# Patient Record
Sex: Female | Born: 1960
Health system: Southern US, Community
[De-identification: ages and names within clinical notes are randomized; demographics above are authoritative.]

## PROBLEM LIST (undated history)

## (undated) DIAGNOSIS — Z9889 Other specified postprocedural states: Secondary | ICD-10-CM

## (undated) DIAGNOSIS — R112 Nausea with vomiting, unspecified: Secondary | ICD-10-CM

## (undated) DIAGNOSIS — F32A Depression, unspecified: Secondary | ICD-10-CM

## (undated) DIAGNOSIS — F419 Anxiety disorder, unspecified: Secondary | ICD-10-CM

## (undated) DIAGNOSIS — E039 Hypothyroidism, unspecified: Secondary | ICD-10-CM

## (undated) HISTORY — PX: ABDOMINAL HYSTERECTOMY: SHX81

---

## 2002-03-06 ENCOUNTER — Observation Stay (HOSPITAL_COMMUNITY): Admission: RE | Admit: 2002-03-06 | Discharge: 2002-03-07 | Payer: Self-pay

## 2003-04-24 ENCOUNTER — Encounter: Admission: RE | Admit: 2003-04-24 | Discharge: 2003-04-24 | Payer: Self-pay | Admitting: Orthopedic Surgery

## 2003-05-07 ENCOUNTER — Encounter: Admission: RE | Admit: 2003-05-07 | Discharge: 2003-05-07 | Payer: Self-pay | Admitting: Orthopedic Surgery

## 2005-05-09 HISTORY — PX: GALLBLADDER SURGERY: SHX652

## 2006-05-09 HISTORY — PX: BREAST BIOPSY: SHX20

## 2017-06-04 NOTE — Progress Notes (Signed)
Sandra Stanton [address] [place], [place] [address] Phone: [phone] Subjective:     CC: Right shoulder pain  BJY:[id number]  Sandra Stanton is a 57 y.o. female coming in with complaint of right shoulder pain. States she tried to do push ups when she injured her shoulder. Doesn't recall hearing a pop. She's stopped doing pushups and free weights. She states that if she uses her arm a lot distally her forearm starts to ache.   Onset- 3 months ago Location- lateral shoulder  Duration- Character- Ache, sharp, burning sensation when she reaches up Aggravating factors- Reaching up, sleeping on her right side,  Reliving factors-  Therapies tried- Ice, Ibuprofen, CBD oil Severity-5 out of 10 and worsening     History reviewed. No pertinent past medical history. Past Surgical History:  Procedure Laterality Date  . BREAST BIOPSY  2008  . CESAREAN SECTION  [id number]  . GALLBLADDER SURGERY  2007   Social History   Socioeconomic History  . Marital status: Married    Spouse name: None  . Number of children: None  . Years of education: None  . Highest education level: None  Social Needs  . Financial resource strain: None  . Food insecurity - worry: None  . Food insecurity - inability: None  . Transportation needs - medical: None  . Transportation needs - non-medical: None  Occupational History  . None  Tobacco Use  . Smoking status: Former Games developer  . Smokeless tobacco: Never Used  Substance and Sexual Activity  . Alcohol use: None  . Drug use: None  . Sexual activity: None  Other Topics Concern  . None  Social History Narrative  . None   Not on File History reviewed. No pertinent family history.   Past medical history, social, surgical and family history all reviewed in electronic medical record.  No pertanent information unless stated regarding to the chief complaint.   Review of Systems:Review of systems updated and as accurate  as of 06/05/17  No headache, visual changes, nausea, vomiting, diarrhea, constipation, dizziness, abdominal pain, skin rash, fevers, chills, night sweats, weight loss, swollen lymph nodes, body aches, joint swelling, muscle aches, chest pain, shortness of breath, mood changes.   Objective  Blood pressure 110/78, pulse 66, height 5\' 6"  (1.676 m), weight 148 lb (67.1 kg), SpO2 97 %. Systems examined below as of 06/05/17   General: No apparent distress alert and oriented x3 mood and affect normal, dressed appropriately.  HEENT: Pupils equal, extraocular movements intact  Respiratory: Patient's speak in full sentences and does not appear short of breath  Cardiovascular: No lower extremity edema, non tender, no erythema  Skin: Warm dry intact with no signs of infection or rash on extremities or on axial skeleton.  Abdomen: Soft nontender  Neuro: Cranial nerves II through XII are intact, neurovascularly intact in all extremities with 2+ DTRs and 2+ pulses.  Lymph: No lymphadenopathy of posterior or anterior cervical chain or axillae bilaterally.  Gait normal with good balance and coordination.  MSK:  Non tender with full range of motion and good stability and symmetric strength and tone of  elbows, wrist, hip, knee and ankles bilaterally.  Shoulder: Right Inspection reveals no abnormalities, atrophy or asymmetry. Palpation is normal with no tenderness over AC joint or bicipital groove. ROM is full in all planes passively. Rotator cuff strength normal throughout. signs of impingement with positive Neer and Hawkin's tests, but negative empty can sign. Speeds and Yergason's tests  normal. No labral pathology noted with negative Obrien's, negative clunk and good stability. Normal scapular function observed. No painful arc and no drop arm sign. No apprehension sign  MSK US performed of: Right This study was ordered, performed, and interpreted by Terrilee FilesZach Smith D.O.  Shoulder:   Supraspinatus:   Appears normal on long and transverse views, Bursal bulge seen with shoulder abduction on impingement view.  Patient also does have what appears to be a small compression fracture noted.  No true healing aspect noted.  Right at the insertion of the supraspinatus. AC joint: Mild arthritis Glenohumeral Joint:  Appears normal without effusion. Glenoid Labrum:  Intact without visualized tears. Biceps Tendon:  Appears normal on long and transverse views, no fraying of tendon, tendon located in intertubercular groove, no subluxation with shoulder internal or external rotation.  Impression: Subacromial bursitis, underlying fracture noted.  [id number]; 15 additional minutes spent for Therapeutic exercises as stated in above notes.  This included exercises focusing on stretching, strengthening, with significant focus on eccentric aspects.   Long term goals include an improvement in range of motion, strength, endurance as well as avoiding reinjury. Patient's frequency would include in 1-2 times a day, 3-5 times a week for a duration of 6-12 weeks. Shoulder Exercises that included:  Basic scapular stabilization to include adduction and depression of scapula Scaption, focusing on proper movement and good control Internal and External rotation utilizing a theraband, with elbow tucked at side entire time Rows with theraband which was given    Proper technique shown and discussed handout in great detail with ATC.  All questions were discussed and answered.      Impression and Recommendations:     This case required medical decision making of moderate complexity.      Note: This dictation was prepared with Dragon dictation along with smaller phrase technology. Any transcriptional errors that result from this process are unintentional.

## 2017-06-05 ENCOUNTER — Ambulatory Visit (INDEPENDENT_AMBULATORY_CARE_PROVIDER_SITE_OTHER): Payer: BLUE CROSS/BLUE SHIELD | Admitting: Family Medicine

## 2017-06-05 ENCOUNTER — Ambulatory Visit (INDEPENDENT_AMBULATORY_CARE_PROVIDER_SITE_OTHER)
Admission: RE | Admit: 2017-06-05 | Discharge: 2017-06-05 | Disposition: A | Discharge status: Home / Self Care | Payer: BLUE CROSS/BLUE SHIELD | Source: Ambulatory Visit | Attending: Family Medicine | Admitting: Family Medicine

## 2017-06-05 ENCOUNTER — Encounter: Payer: Self-pay | Admitting: Family Medicine

## 2017-06-05 ENCOUNTER — Ambulatory Visit: Payer: Self-pay

## 2017-06-05 VITALS — BP 110/78 | HR 66 | Ht 66.0 in | Wt 148.0 lb

## 2017-06-05 DIAGNOSIS — G8929 Other chronic pain: Secondary | ICD-10-CM | POA: Insufficient documentation

## 2017-06-05 DIAGNOSIS — M25511 Pain in right shoulder: Secondary | ICD-10-CM

## 2017-06-05 MED ORDER — VITAMIN D (ERGOCALCIFEROL) 1.25 MG (50000 UNIT) PO CAPS: 50000.0000 [IU] | ORAL_CAPSULE | ORAL | 0 refills | Status: DC

## 2017-06-05 NOTE — Assessment & Plan Note (Signed)
Truly going on 3 months.  Very small intrasubstance tear noted of the supraspinatus but no true retraction noted.  Patient does have a mild fullness in the lateral aspect of the humerus and I want to rule out any type of fracture.  Patient on ultrasound did have what appeared to be a humeral compression fracture on the anterior aspect anterior superior aspect.  Patient given home exercises, once weekly vitamin D, topical anti-inflammatories to try.  Patient will try these interventions and come back and see me again 4-6 weeks.  Consider injections and physical therapy.

## 2017-06-05 NOTE — Patient Instructions (Signed)
Good to see yo u Ice 20 minutes 2 times daily. Usually after activity and before bed. Exercises 3 times a week.  pennsaid pinkie amount topically 2 times daily as needed.  Once weekly vitamin D for 12 weeks Try to increase activity but weight 50% of what you were doing and keep hands within peripheral vision  See me again in 4 weeks.

## 2017-06-12 DIAGNOSIS — D225 Melanocytic nevi of trunk: Secondary | ICD-10-CM | POA: Diagnosis not present

## 2017-06-12 DIAGNOSIS — Z85820 Personal history of malignant melanoma of skin: Secondary | ICD-10-CM | POA: Diagnosis not present

## 2017-06-12 DIAGNOSIS — Z08 Encounter for follow-up examination after completed treatment for malignant neoplasm: Secondary | ICD-10-CM | POA: Diagnosis not present

## 2017-06-13 DIAGNOSIS — F320 Major depressive disorder, single episode, mild: Secondary | ICD-10-CM | POA: Diagnosis not present

## 2017-06-27 DIAGNOSIS — F320 Major depressive disorder, single episode, mild: Secondary | ICD-10-CM | POA: Diagnosis not present

## 2017-07-02 NOTE — Progress Notes (Signed)
Sandra Stanton, Sandra Stanton Phone: [phone] Subjective:     CC: Right shoulder pain follow-up  BJY:NWGNFAOZHY  Sandra Stanton is a 57 y.o. female coming in with complaint of right shoulder pain.  Found to have what appeared to be a potential compression fracture.  Also found for some subacromial bursitis.  Patient given exercises, icing regimen, and vitamin D.  Patient states   may be a little better but not severely better.  Patient states that the pain is still waking him up at night.     No past medical history on file. Past Surgical History:  Procedure Laterality Date  . BREAST BIOPSY  2008  . CESAREAN SECTION  [id number]  . GALLBLADDER SURGERY  2007   Social History   Socioeconomic History  . Marital status: Married    Spouse name: None  . Number of children: None  . Years of education: None  . Highest education level: None  Social Needs  . Financial resource strain: None  . Food insecurity - worry: None  . Food insecurity - inability: None  . Transportation needs - medical: None  . Transportation needs - non-medical: None  Occupational History  . None  Tobacco Use  . Smoking status: Former Games developer  . Smokeless tobacco: Never Used  Substance and Sexual Activity  . Alcohol use: None  . Drug use: None  . Sexual activity: None  Other Topics Concern  . None  Social History Narrative  . None   Not on File No family history on file.  No family history of autoimmune   Past medical history, social, surgical and family history all reviewed in electronic medical record.  No pertanent information unless stated regarding to the chief complaint.   Review of Systems:Review of systems updated and as accurate as of 07/03/17  No headache, visual changes, nausea, vomiting, diarrhea, constipation, dizziness, abdominal pain, skin rash, fevers, chills, night sweats, weight loss, swollen lymph nodes, body aches, joint  swelling, chest pain, shortness of breath, mood changes.  Positive muscle aches  Objective  Blood pressure 100/62, pulse 69, height 5\' 6"  (1.676 m), weight 147 lb (66.7 kg), SpO2 98 %. Systems examined below as of 07/03/17   General: No apparent distress alert and oriented x3 mood and affect normal, dressed appropriately.  HEENT: Pupils equal, extraocular movements intact  Respiratory: Patient's speak in full sentences and does not appear short of breath  Cardiovascular: No lower extremity edema, non tender, no erythema  Skin: Warm dry intact with no signs of infection or rash on extremities or on axial skeleton.  Abdomen: Soft nontender  Neuro: Cranial nerves II through XII are intact, neurovascularly intact in all extremities with 2+ DTRs and 2+ pulses.  Lymph: No lymphadenopathy of posterior or anterior cervical chain or axillae bilaterally.  Gait normal with good balance and coordination.  MSK:  Non tender with full range of motion and good stability and symmetric strength and tone of  elbows, wrist, hip, knee and ankles bilaterally.   Shoulder: Right Inspection reveals no abnormalities, atrophy or asymmetry. Palpation is normal with no tenderness over AC joint or bicipital groove. ROM is full in all planes. Rotator cuff strength normal throughout. Positive impingement Speeds and Yergason's tests normal. No labral pathology noted with negative Obrien's, negative clunk and good stability. Normal scapular function observed. No painful arc and no drop arm sign. No apprehension sign Contralateral shoulder unremarkable  After informed written and  verbal consent, patient was seated on exam table. Right shoulder was prepped with alcohol swab and utilizing posterior approach, patient's right glenohumeral space was injected with 4:1  marcaine 0.5%: Kenalog 40mg /dL. Patient tolerated the procedure well without immediate complications.       Impression and Recommendations:     This  case required medical decision making of moderate complexity.      Note: This dictation was prepared with Dragon dictation along with smaller phrase technology. Any transcriptional errors that result from this process are unintentional.

## 2017-07-03 ENCOUNTER — Ambulatory Visit (INDEPENDENT_AMBULATORY_CARE_PROVIDER_SITE_OTHER): Payer: BLUE CROSS/BLUE SHIELD | Admitting: Family Medicine

## 2017-07-03 ENCOUNTER — Encounter: Payer: Self-pay | Admitting: Family Medicine

## 2017-07-03 ENCOUNTER — Ambulatory Visit: Payer: Self-pay

## 2017-07-03 VITALS — BP 100/62 | HR 69 | Ht 66.0 in | Wt 147.0 lb

## 2017-07-03 DIAGNOSIS — M25511 Pain in right shoulder: Principal | ICD-10-CM

## 2017-07-03 DIAGNOSIS — G8929 Other chronic pain: Secondary | ICD-10-CM

## 2017-07-03 MED ORDER — VITAMIN D (ERGOCALCIFEROL) 1.25 MG (50000 UNIT) PO CAPS: 50000.0000 [IU] | ORAL_CAPSULE | ORAL | 0 refills | Status: DC

## 2017-07-03 NOTE — Assessment & Plan Note (Signed)
Patient given injection today.  I do think that there is still some bony abnormality.  Encourage patient to continue the once weekly vitamin D.  We discussed icing regimen.  Follow-up again in 4 weeks

## 2017-07-03 NOTE — Patient Instructions (Addendum)
Good to see you  Ice is your friend Take 2 days off then start the exercises again .  Injected the shoulder today  Refilled the vitamin D  See me again in 4 weeks

## 2017-07-11 DIAGNOSIS — F320 Major depressive disorder, single episode, mild: Secondary | ICD-10-CM | POA: Diagnosis not present

## 2017-07-27 DIAGNOSIS — F320 Major depressive disorder, single episode, mild: Secondary | ICD-10-CM | POA: Diagnosis not present

## 2017-07-30 NOTE — Progress Notes (Signed)
Tawana ScaleZach Darius Fillingim D.O. [facility] [address] [place], [place] [address] Phone: [phone] Subjective:     CC: Right shoulder pain follow-up  BJY:NWGNFAOZHYHPI:Subjective  Sandra StainJennifer C Stanton is a [age] y.o. female coming in with complaint of right shoulder pain.  Found to have a small compression fracture.  Started once weekly vitamin D.  Continue to have a small effusion was given injection July 03, 2017.  Patient was to continue conservative therapy otherwise including home exercises and icing regimen.  Patient states that her shoulder is not getting any better. She did put some lotion on her lower back performing internal rotation and this caused an increase in her pain. Patient states that she also reached over to her alarm and overextended her arm.      No past medical history on file. Past Surgical History:  Procedure Laterality Date  . BREAST BIOPSY  2008  . CESAREAN SECTION  [id number],1990  . GALLBLADDER SURGERY  2007   Social History   Socioeconomic History  . Marital status: Married    Spouse name: Not on file  . Number of children: Not on file  . Years of education: Not on file  . Highest education level: Not on file  Occupational History  . Not on file  Social Needs  . Financial resource strain: Not on file  . Food insecurity:    Worry: Not on file    Inability: Not on file  . Transportation needs:    Medical: Not on file    Non-medical: Not on file  Tobacco Use  . Smoking status: Former Games developermoker  . Smokeless tobacco: Never Used  Substance and Sexual Activity  . Alcohol use: Not on file  . Drug use: Not on file  . Sexual activity: Not on file  Lifestyle  . Physical activity:    Days per week: Not on file    Minutes per session: Not on file  . Stress: Not on file  Relationships  . Social connections:    Talks on phone: Not on file    Gets together: Not on file    Attends religious service: Not on file    Active member of club or organization: Not on file   Attends meetings of clubs or organizations: Not on file    Relationship status: Not on file  Other Topics Concern  . Not on file  Social History Narrative  . Not on file   Not on File No family history on file.   Past medical history, social, surgical and family history all reviewed in electronic medical record.  No pertanent information unless stated regarding to the chief complaint.   Review of Systems:Review of systems updated and as accurate as of 07/31/17  No headache, visual changes, nausea, vomiting, diarrhea, constipation, dizziness, abdominal pain, skin rash, fevers, chills, night sweats, weight loss, swollen lymph nodes, body aches, joint swelling, muscle aches, chest pain, shortness of breath, mood changes.   Objective  Blood pressure 118/76, pulse 80, height 5\' 6"  (1.676 m), weight 144 lb (65.3 kg), SpO2 98 %. Systems examined below as of 07/31/17   General: No apparent distress alert and oriented x3 mood and affect normal, dressed appropriately.  HEENT: Pupils equal, extraocular movements intact  Respiratory: Patient's speak in full sentences and does not appear short of breath  Cardiovascular: No lower extremity edema, non tender, no erythema  Skin: Warm dry intact with no signs of infection or rash on extremities or on axial skeleton.  Abdomen: Soft nontender  Neuro: Cranial nerves II through XII are intact, neurovascularly intact in all extremities with 2+ DTRs and 2+ pulses.  Lymph: No lymphadenopathy of posterior or anterior cervical chain or axillae bilaterally.  Gait normal with good balance and coordination.  MSK:  Non tender with full range of motion and good stability and symmetric strength and tone of  elbows, wrist, hip, knee and ankles bilaterally.  Shoulder: Right Inspection reveals no abnormalities, atrophy or asymmetry. Palpation is normal with no tenderness over AC joint or bicipital groove. ROM is full in all planes. Rotator cuff strength 4+ out of  5 No signs of impingement with negative Neer and Hawkin's tests, empty can sign. Speeds and Yergason's tests normal. Positive O'Brien's Bilateral shoulder unremarkable  MSK US performed of: Right  this study was ordered, performed, and interpreted by Terrilee Files D.O.  Shoulder:   Supraspinatus: Patient does have some degenerative tearing of the supraspinatus noted.  Patient does have what appears to be a possible anterior labral tear also noted.  Patient does have a small glenohumeral joint effusion noted.   Impression and Recommendations:     This case required medical decision making of moderate complexity.      Note: This dictation was prepared with Dragon dictation along with smaller phrase technology. Any transcriptional errors that result from this process are unintentional.

## 2017-07-31 ENCOUNTER — Ambulatory Visit: Payer: Self-pay

## 2017-07-31 ENCOUNTER — Encounter: Payer: Self-pay | Admitting: Family Medicine

## 2017-07-31 ENCOUNTER — Ambulatory Visit (INDEPENDENT_AMBULATORY_CARE_PROVIDER_SITE_OTHER): Payer: BLUE CROSS/BLUE SHIELD | Admitting: Family Medicine

## 2017-07-31 VITALS — BP 118/76 | HR 80 | Ht 66.0 in | Wt 144.0 lb

## 2017-07-31 DIAGNOSIS — G8929 Other chronic pain: Secondary | ICD-10-CM

## 2017-07-31 DIAGNOSIS — M25511 Pain in right shoulder: Principal | ICD-10-CM

## 2017-07-31 NOTE — Patient Instructions (Signed)
Good to see you  We will get the MR-arthrogram.  Gustavus Bryantce is your friend.  Keep hands within peripheral visioon  See me again 1-2 days after MRI and we will discuss

## 2017-07-31 NOTE — Assessment & Plan Note (Signed)
Patient is failed all conservative therapy.  Patient was given an injection but did not make significant improvement.  Patient is continuing to have pain on a daily basis and even small amount of movement seem to be making it worse.  Rates the severity of pain is 8 out of 10.  I believe that a MR arthrogram is necessary at this time with patient not making significant strides of the course the last several months and has failed all conservative therapy.  Arthrogram will be done to make sure and rule out labral tear.  Patient will follow-up in 1-2 days after the imaging and discuss further.

## 2017-08-02 ENCOUNTER — Encounter: Payer: Self-pay | Admitting: Family Medicine

## 2017-08-08 DIAGNOSIS — F320 Major depressive disorder, single episode, mild: Secondary | ICD-10-CM | POA: Diagnosis not present

## 2017-08-16 ENCOUNTER — Ambulatory Visit
Admission: RE | Admit: 2017-08-16 | Discharge: 2017-08-16 | Disposition: A | Discharge status: Home / Self Care | Payer: BLUE CROSS/BLUE SHIELD | Source: Ambulatory Visit | Attending: Family Medicine | Admitting: Family Medicine

## 2017-08-16 DIAGNOSIS — M25511 Pain in right shoulder: Secondary | ICD-10-CM | POA: Diagnosis not present

## 2017-08-16 DIAGNOSIS — M19011 Primary osteoarthritis, right shoulder: Secondary | ICD-10-CM | POA: Diagnosis not present

## 2017-08-16 DIAGNOSIS — M7591 Shoulder lesion, unspecified, right shoulder: Secondary | ICD-10-CM | POA: Diagnosis not present

## 2017-08-16 DIAGNOSIS — G8929 Other chronic pain: Secondary | ICD-10-CM

## 2017-08-16 MED ORDER — IOPAMIDOL (ISOVUE-M 200) INJECTION 41%
15.0000 mL | Freq: Once | INTRAMUSCULAR | Status: AC
  Administered 2017-08-16: 15 mL via INTRA_ARTICULAR

## 2017-08-21 NOTE — Progress Notes (Signed)
[name] [address] [place], [place] [address] Phone: [phone] Subjective:      CC: Shoulder pain follow-up.  [id number]  Sandra Stanton is a 57 y.o. female coming in with complaint of shoulder pain. She continues to have pain and is here to discuss her MRI.  Additionally ultrasound showed what seemed to be more of a small compression fracture.  Was not making any improvement.  Attempted injection.  Patient did have the MRI that was independently visualized by me showing a sprain of the ligament but otherwise unremarkable. Patient actually states that the pain during the day seems to be improved.    No past medical history on file. Past Surgical History:  Procedure Laterality Date  . BREAST BIOPSY  2008  . CESAREAN SECTION  [id number]  . GALLBLADDER SURGERY  2007   Social History   Socioeconomic History  . Marital status: Married    Spouse name: Not on file  . Number of children: Not on file  . Years of education: Not on file  . Highest education level: Not on file  Occupational History  . Not on file  Social Needs  . Financial resource strain: Not on file  . Food insecurity:    Worry: Not on file    Inability: Not on file  . Transportation needs:    Medical: Not on file    Non-medical: Not on file  Tobacco Use  . Smoking status: Former Games developer  . Smokeless tobacco: Never Used  Substance and Sexual Activity  . Alcohol use: Not on file  . Drug use: Not on file  . Sexual activity: Not on file  Lifestyle  . Physical activity:    Days per week: Not on file    Minutes per session: Not on file  . Stress: Not on file  Relationships  . Social connections:    Talks on phone: Not on file    Gets together: Not on file    Attends religious service: Not on file    Active member of club or organization: Not on file    Attends meetings of clubs or organizations: Not on file    Relationship status: Not on file  Other Topics  Concern  . Not on file  Social History Narrative  . Not on file   Not on File No family history on file.  No family history of autoimmune   Past medical history, social, surgical and family history all reviewed in electronic medical record.  No pertanent information unless stated regarding to the chief complaint.   Review of Systems:Review of systems updated and as accurate as of 08/22/17  No headache, visual changes, nausea, vomiting, diarrhea, constipation, dizziness, abdominal pain, skin rash, fevers, chills, night sweats, weight loss, swollen lymph nodes, body aches, joint swelling,  chest pain, shortness of breath, mood changes.  Positive muscle aches  Objective  Blood pressure 102/64, pulse 78, height 5\' 6"  (1.676 m), weight 145 lb (65.8 kg), SpO2 98 %. Systems examined below as of 08/22/17   General: No apparent distress alert and oriented x3 mood and affect normal, dressed appropriately.  HEENT: Pupils equal, extraocular movements intact  Respiratory: Patient's speak in full sentences and does not appear short of breath  Cardiovascular: No lower extremity edema, non tender, no erythema  Skin: Warm dry intact with no signs of infection or rash on extremities or on axial skeleton.  Abdomen: Soft nontender  Neuro: Cranial nerves II through XII  are intact, neurovascularly intact in all extremities with 2+ DTRs and 2+ pulses.  Lymph: No lymphadenopathy of posterior or anterior cervical chain or axillae bilaterally.  Gait normal with good balance and coordination.  MSK:  Non tender with full range of motion and good stability and symmetric strength and tone of  elbows, wrist, hip, knee and ankles bilaterally.  Shoulder: Right Inspection reveals no abnormalities, atrophy or asymmetry. Palpation is normal with no tenderness over AC joint or bicipital groove. ROM is limited actively but does have some near full passive range of motion though. Rotator cuff strength normal  throughout. Positive impingement Speeds and Yergason's tests normal. No labral pathology noted with negative Obrien's, negative clunk and good stability. Normal scapular function observed. No painful arc and no drop arm sign. No apprehension sign Contralateral shoulder unremarkable.     Impression and Recommendations:     This case required medical decision making of moderate complexity.      Note: This dictation was prepared with Dragon dictation along with smaller phrase technology. Any transcriptional errors that result from this process are unintentional.

## 2017-08-22 ENCOUNTER — Encounter: Payer: Self-pay | Admitting: Family Medicine

## 2017-08-22 ENCOUNTER — Ambulatory Visit (INDEPENDENT_AMBULATORY_CARE_PROVIDER_SITE_OTHER): Payer: BLUE CROSS/BLUE SHIELD | Admitting: Family Medicine

## 2017-08-22 DIAGNOSIS — G8929 Other chronic pain: Secondary | ICD-10-CM | POA: Diagnosis not present

## 2017-08-22 DIAGNOSIS — M25511 Pain in right shoulder: Secondary | ICD-10-CM

## 2017-08-22 DIAGNOSIS — F320 Major depressive disorder, single episode, mild: Secondary | ICD-10-CM | POA: Diagnosis not present

## 2017-08-22 MED ORDER — GABAPENTIN 100 MG PO CAPS: 200.0000 mg | ORAL_CAPSULE | Freq: Every day | ORAL | 3 refills | Status: DC

## 2017-08-22 NOTE — Patient Instructions (Addendum)
Good to see you  Sandra Stanton is your friend.  Keep hands within peripheral vision  New exercises 3 times a week  Gabapentin 200mg  at night See me again in 4 weeks

## 2017-08-22 NOTE — Assessment & Plan Note (Signed)
MRI was fairly unremarkable.  Patient declined formal physical therapy or injection again today.  Patient wants to try conservative therapy including icing regimen, home exercises, will discuss with patient about which activities to do which wants to avoid.  Patient will follow up with me again 4 weeks.  At that time if continued to have difficulty will refer patient.

## 2017-08-28 ENCOUNTER — Encounter: Payer: Self-pay | Admitting: Family Medicine

## 2017-09-18 NOTE — Progress Notes (Signed)
[name] [address] [place], [place] [address] Phone: [phone] Subjective:     CC: Shoulder pain follow-up  [id number]  Sandra Stanton is a 57 y.o. female coming in with complaint of shoulder pain. Patient states that she is doing better since last visit. She did take one week off of work to help rest her shoulder. She did work Friday without a sling and had little pain. She notices that she does not have full range of motion. She is doing her exercise daily.  Patient was sent for an MRI that showed the patient did have a sprain of the inferior glenoid ligament but I believe though when independently visualized seems to be early frozen shoulder.  Patient's MRI otherwise was fairly unremarkable.      No past medical history on file. Past Surgical History:  Procedure Laterality Date  . BREAST BIOPSY  2008  . CESAREAN SECTION  [id number]  . GALLBLADDER SURGERY  2007   Social History   Socioeconomic History  . Marital status: Married    Spouse name: Not on file  . Number of children: Not on file  . Years of education: Not on file  . Highest education level: Not on file  Occupational History  . Not on file  Social Needs  . Financial resource strain: Not on file  . Food insecurity:    Worry: Not on file    Inability: Not on file  . Transportation needs:    Medical: Not on file    Non-medical: Not on file  Tobacco Use  . Smoking status: Former Games developer  . Smokeless tobacco: Never Used  Substance and Sexual Activity  . Alcohol use: Not on file  . Drug use: Not on file  . Sexual activity: Not on file  Lifestyle  . Physical activity:    Days per week: Not on file    Minutes per session: Not on file  . Stress: Not on file  Relationships  . Social connections:    Talks on phone: Not on file    Gets together: Not on file    Attends religious service: Not on file    Active member of club or organization: Not on file    Attends  meetings of clubs or organizations: Not on file    Relationship status: Not on file  Other Topics Concern  . Not on file  Social History Narrative  . Not on file   Not on File No family history on file.   Past medical history, social, surgical and family history all reviewed in electronic medical record.  No pertanent information unless stated regarding to the chief complaint.   Review of Systems:Review of systems updated and as accurate as of 09/19/17  No headache, visual changes, nausea, vomiting, diarrhea, constipation, dizziness, abdominal pain, skin rash, fevers, chills, night sweats, weight loss, swollen lymph nodes, body aches, joint swelling, muscle aches, chest pain, shortness of breath, mood changes.   Objective  Blood pressure 110/76, pulse 79, height  (1.676 m), weight 150 lb (68 kg), SpO2 99 %. Systems examined below as of 09/19/17   General: No apparent distress alert and oriented x3 mood and affect normal, dressed appropriately.  HEENT: Pupils equal, extraocular movements intact  Respiratory: Patient's speak in full sentences and does not appear short of breath  Cardiovascular: No lower extremity edema, non tender, no erythema  Skin: Warm dry intact with no signs of infection or rash on extremities  or on axial skeleton.  Abdomen: Soft nontender  Neuro: Cranial nerves II through XII are intact, neurovascularly intact in all extremities with 2+ DTRs and 2+ pulses.  Lymph: No lymphadenopathy of posterior or anterior cervical chain or axillae bilaterally.  Gait normal with good balance and coordination.  MSK:  Non tender with full range of motion and good stability and symmetric strength and tone of  elbows, wrist, hip, knee and ankles bilaterally.   Right shoulder exam shows the patient does have positive impingement.  Range of motion is decreased in all planes especially with external range of motion.  Patient has some mild crepitus noted.  Good grip strength.  4+ out  of 5 strength compared to the contralateral side.    Impression and Recommendations:     This case required medical decision making of moderate complexity.      Note: This dictation was prepared with Dragon dictation along with smaller phrase technology. Any transcriptional errors that result from this process are unintentional.

## 2017-09-19 ENCOUNTER — Ambulatory Visit (INDEPENDENT_AMBULATORY_CARE_PROVIDER_SITE_OTHER): Payer: BLUE CROSS/BLUE SHIELD | Admitting: Family Medicine

## 2017-09-19 ENCOUNTER — Encounter: Payer: Self-pay | Admitting: Family Medicine

## 2017-09-19 VITALS — BP 110/76 | HR 79 | Ht 66.0 in | Wt 150.0 lb

## 2017-09-19 DIAGNOSIS — M7501 Adhesive capsulitis of right shoulder: Secondary | ICD-10-CM | POA: Diagnosis not present

## 2017-09-19 NOTE — Patient Instructions (Signed)
Good to see you  Overall not bad news  Stay active and push it Pt will be calling you  I want to see you again in 3-4 weeks and then at that time we can try another injection to get you the rest of the way I hope

## 2017-09-19 NOTE — Assessment & Plan Note (Addendum)
Rule out any significant intra-articular pathology.  Patient does have some tightness and encourage her to go to formal physical therapy.  Patient is willing to do that at this moment.  Discussed continue with the gabapentin for nighttime pain as well as once weekly vitamin D.  Depending on patient response patient could be ready for another injection at follow-up.  Follow-up again in 6 weeks Spent  25 minutes with patient face-to-face and had greater than 50% of counseling including as described above in assessment and plan.

## 2017-09-26 DIAGNOSIS — F320 Major depressive disorder, single episode, mild: Secondary | ICD-10-CM | POA: Diagnosis not present

## 2017-10-03 ENCOUNTER — Encounter: Payer: Self-pay | Admitting: Rehabilitative and Restorative Service Providers"

## 2017-10-03 ENCOUNTER — Ambulatory Visit (INDEPENDENT_AMBULATORY_CARE_PROVIDER_SITE_OTHER): Payer: BLUE CROSS/BLUE SHIELD | Admitting: Rehabilitative and Restorative Service Providers"

## 2017-10-03 DIAGNOSIS — M25611 Stiffness of right shoulder, not elsewhere classified: Secondary | ICD-10-CM

## 2017-10-03 DIAGNOSIS — M25511 Pain in right shoulder: Secondary | ICD-10-CM

## 2017-10-03 DIAGNOSIS — R293 Abnormal posture: Secondary | ICD-10-CM

## 2017-10-03 DIAGNOSIS — G8929 Other chronic pain: Secondary | ICD-10-CM

## 2017-10-03 NOTE — Patient Instructions (Addendum)
Self massage - using ~ 4 inch plastic ball   Axial Extension (Chin Tuck)    Pull chin in and lengthen back of neck. Hold __5-10__ seconds while counting out loud. Repeat __10__ times. Do __several__ sessions per day.  Shoulder Blade Squeeze   With swim noodle  Rotate shoulders back, then squeeze shoulder blades down and backr. Hold 10 sec Repeat __10__ times. Do _several __ sessions per day.  Upper Back Strength: Lower Trapezius / Rotator Cuff " L's "     Arms in waitress pose, palms up. Press hands back and slide shoulder blades down. Hold for __5__ seconds. Repeat _10___ times. 1-2 times per day.    Scapular Retraction: Elbow Flexion (Standing)  "W's"     With elbows bent to 90, pinch shoulder blades together and rotate arms out, keeping elbows bent. Repeat __10__ times per set. Do __1-2__ sets per session. Do _several ___ sessions per day.   ROM: External / Internal Rotation - Wand standing     Holding wand with left hand palm up, push out from body with other hand, palm down. Keep both elbows bent. When stretch is felt, hold __30__ seconds. Repeat to other side, leading with same hand. Keep elbows bent. Repeat __3__ times per set.  Do __2-3__ sessions per day.  ELBOW: Biceps - Standing    Standing at counter or in doorway, place one hand on wall, elbow straight. Lean forward. Hold _30__ seconds. __2-3_ reps per set, __2-3_ sets per day  Pulley flexion  [facility]  Or on line       [facility] [address] [place], [place] [address]  [phone] (office) [phone] (fax)

## 2017-10-03 NOTE — Therapy (Signed)
[facility] [place] [address] [place], [place], [address] Phone: [phone]   Fax:  [phone]  Physical Therapy Evaluation  Patient Details  Name: Sandra Stanton MRN: [id number] Date of Birth: November 06, 1960 Referring Provider: Dr Terrilee Files    Encounter Date: 10/03/2017  PT End of Session - 10/03/17 1013    Visit Number  1    Number of Visits  12    Date for PT Re-Evaluation  11/07/17    PT Start Time  1013    PT Stop Time  1114    PT Time Calculation (min)  61 min    Activity Tolerance  Patient tolerated treatment well       History reviewed. No pertinent past medical history.  Past Surgical History:  Procedure Laterality Date  . BREAST BIOPSY  2008  . CESAREAN SECTION  [id number]  . GALLBLADDER SURGERY  2007    There were no vitals filed for this visit.   Subjective Assessment - 10/03/17 1023    Subjective  Patient reports that she has had Rt shoulder pain since 10/18 when she injured Rt shoulder doing push ups. She fell the end of November 2018 and symptoms have continued since that time. She has been seen by Dr Katrinka Blazing several times receiving an injection 07/03/17 with some improvement. Symptoms have continued with MRI showing irritation of with supraspinitus and underlying fx and impingement. Symptoms have continued to progress - even with injection; exercise and medication.     Pertinent History  c-sections; gall baldder surgery; hysterectomy; melanoma Rt distal biceps - removed ~ 3-4 yrs ago - no further treatment f/u every 6 months     Diagnostic tests  MRI arthrogram     Patient Stated Goals  get rid og the shoulder pain and use arm again normally     Currently in Pain?  No/denies    Pain Score  0-No pain    Pain Location  Shoulder    Pain Orientation  Right    Pain Descriptors / Indicators  Sharp;Burning with certain quick movements - with normal activities a deep ache     Pain Type  Chronic pain    Pain  Radiating Towards  at times to forearm and hand but mostly into arm to elbow     Pain Onset  More than a month ago    Pain Frequency  Intermittent    Aggravating Factors   sudden, quick movements; reaching up; stretching    Pain Relieving Factors  rest; ice maybe          [facility] PT Assessment - 10/03/17 0001      Assessment   Medical Diagnosis  Adhesive Capsulitis Rt shoulder    Referring Provider  Dr Terrilee Files     Onset Date/Surgical Date  02/06/17    Hand Dominance  Right    Next MD Visit  10/17/17    Prior Therapy  no       Precautions   Precautions  None      Balance Screen   Has the patient fallen in the past 6 months  Yes    How many times?  1    Has the patient had a decrease in activity level because of a fear of falling?   No    Is the patient reluctant to leave their home because of a fear of falling?   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     Leisure  household chores;yard work; Database administrator; sewing; computer classes and work; workout at home 2-4 days/wk w/ free wts/bands/treatmill       Observation/Other Assessments   Focus on Therapeutic Outcomes (FOTO)   45% limitation       Sensation   Additional Comments  WFL's per pt report       Posture/Postural Control   Posture Comments  head forward; shoulders rounded and elevated; head of the humerus anterior in orientation; scapulae abducted and rotated along the thoracic wall       AROM   Right Shoulder Extension  29 Degrees    Right Shoulder Flexion  109 Degrees    Right Shoulder ABduction  80 Degrees    Right Shoulder Internal Rotation  11 Degrees supported at ~ 80 deg abd     Right Shoulder External Rotation  45 Degrees supported at ~ 80 deg abd    Left Shoulder Extension  52 Degrees    Left Shoulder  Flexion  158 Degrees    Left Shoulder ABduction  162 Degrees    Left Shoulder Internal Rotation  33 Degrees    Left Shoulder External Rotation  93 Degrees      Strength   Overall Strength Comments  UE strength grossly 5/5 with pain with resistive stesting Rt shoulder flexion, ER, IR and weakness through posterior shoulder girdle 4+/5 to 5-/5 middle and lower traps bilat       Palpation   Spinal mobility  hypomobility thoracic CPA and lateral mobs    Palpation comment  muscular tightness Rt > Lt pecs; anterior shoulder through biceps and deltoid; upper traps; leveator; teres                Objective measurements completed on examination: See above findings.      [facility] Adult PT Treatment/Exercise - 10/03/17 0001      Therapeutic Activites    Therapeutic Activities  -- myofacial ball release Rt upper quadrant - standing       Neuro Re-ed    Neuro Re-ed Details   postural correction engaging posterior shoulder girdle musculature       Shoulder Exercises: Standing   Other Standing Exercises  axial extension 10 sec x 5; scap squeeze 10 sec x 10; L's x 5; W's x 5 all with swim noodle       Shoulder Exercises: Pulleys   Flexion  -- 10 sec hold x 10 reps       Shoulder Exercises: Stretch   External Rotation Stretch  5 reps;10 seconds standing with noodle with cane     Other Shoulder Stretches  biceps stretch at table 30 sec x 3       Moist Heat Therapy   Number Minutes Moist Heat  12 Minutes    Moist Heat Location  Shoulder Rt              PT Education - 10/03/17 1233    Education provided  Yes    Education Details  HEP     Person(s) Educated  Patient    Methods  Explanation;Demonstration;Tactile cues;Verbal cues;Handout    Comprehension  Verbalized understanding;Returned demonstration;Verbal cues required;Tactile cues required          PT Long Term Goals - 10/03/17 1234  PT LONG TERM GOAL #1   Title  Improve posture and alignment with patient  demonstrating improved posterior shoudler girdle engagement 11/07/17    Time  6    Period  Weeks    Status  New      PT LONG TERM GOAL #2   Title  Increase Rt shoulder AROM to equal or greater than AROM Lt shoulder 11/07/17    Time  6    Period  Weeks      PT LONG TERM GOAL #3   Title  Decrease pain Rt shoulder by at least 75% with functional activities 11/08/17    Time  6    Period  Weeks    Status  New      PT LONG TERM GOAL #4   Title  Return to normal functional activities with independent HEP 11/07/17    Time  6    Period  Weeks    Status  New      PT LONG TERM GOAL #5   Title  Improve FOTO to </= 32% limitation 11/07/17    Time  6    Period  Weeks    Status  New             Plan - 10/03/17 1245    Clinical Impression Statement  Sandra Stanton presents with 7-8 month history of Rt shoulder pain which has not responded to rest, injection; medication and exercises from MD office. She remembers injuring Rt shoulder 10/18 when doing push ups and then falling on Rt shoulder 11/18. She has had gradually increasing symptoms since that time. Patient has poor cervical and thoracic posture; abnormal kinematics of Rt shoulder motions; decreased Rt shoulder ROM; limited functional activities and pain on a daily basis. Patient will benefit form PT to address problems identified.     Clinical Presentation  Evolving    Clinical Presentation due to:  chronic limitations and pain     Clinical Decision Making  Low    Rehab Potential  Good    Clinical Impairments Affecting Rehab Potential  chronic nature of dysfunction     PT Frequency  2x / week    PT Duration  6 weeks    PT Treatment/Interventions  Patient/family education;ADLs/Self Care Home Management;Cryotherapy;Electrical Stimulation;Iontophoresis /ml Dexamethasone;Moist Heat;Ultrasound;Dry needling;Manual techniques;Neuromuscular re-education;Therapeutic exercise;Therapeutic activities    PT Next Visit Plan  review HEP; assess neural  mobility; add manual work and PROM Rt upper quarter; trial of estim; continue education and postural correction - add shd extension with cane; table slide; IR; etc    Consulted and Agree with Plan of Care  Patient       Patient will benefit from skilled therapeutic intervention in order to improve the following deficits and impairments:  Postural dysfunction, Improper body mechanics, Increased fascial restricitons, Increased muscle spasms, Hypomobility, Decreased range of motion, Decreased mobility, Impaired UE functional use  Visit Diagnosis: Chronic right shoulder pain - Plan: PT plan of care cert/re-cert  Abnormal posture - Plan: PT plan of care cert/re-cert  Stiffness of right shoulder, not elsewhere classified - Plan: PT plan of care cert/re-cert     Problem List Patient Active Problem List   Diagnosis Date Noted  . Adhesive capsulitis of right shoulder 09/19/2017  . Chronic right shoulder pain 06/05/2017    Celyn Rober Minion PT, MPH  10/03/2017, 1:12 PM  [facility] [address] [place], [place], [address] Phone: [phone]   Fax:  [phone]  Name:  Sandra Stanton MRN: [id number] Date of Birth: May 11, 1960

## 2017-10-09 ENCOUNTER — Encounter: Payer: Self-pay | Admitting: Rehabilitative and Restorative Service Providers"

## 2017-10-09 ENCOUNTER — Ambulatory Visit (INDEPENDENT_AMBULATORY_CARE_PROVIDER_SITE_OTHER): Payer: BLUE CROSS/BLUE SHIELD | Admitting: Rehabilitative and Restorative Service Providers"

## 2017-10-09 DIAGNOSIS — M25611 Stiffness of right shoulder, not elsewhere classified: Secondary | ICD-10-CM | POA: Diagnosis not present

## 2017-10-09 DIAGNOSIS — R293 Abnormal posture: Secondary | ICD-10-CM

## 2017-10-09 DIAGNOSIS — G8929 Other chronic pain: Secondary | ICD-10-CM | POA: Diagnosis not present

## 2017-10-09 DIAGNOSIS — M25511 Pain in right shoulder: Secondary | ICD-10-CM | POA: Diagnosis not present

## 2017-10-09 NOTE — Therapy (Signed)
[facility] [place] [address] [place], [place], [address] Phone: [phone]   Fax:  [phone]  Physical Therapy Treatment  Patient Details  Name: Sandra Stanton Date of Birth: [birth year] Referring Provider: Dr Terrilee FilesZach Smith    Encounter Date: 10/09/2017  PT End of Session - 10/09/17 1112    Visit Number  2    Number of Visits  12    Date for PT Re-Evaluation  11/07/17    PT Start Time  1110    PT Stop Time  1205    PT Time Calculation (min)  55 min    Activity Tolerance  Patient tolerated treatment well       History reviewed. No pertinent past medical history.  Past Surgical History:  Procedure Laterality Date  . BREAST BIOPSY  2008  . CESAREAN SECTION  [id number]  . GALLBLADDER SURGERY  2007    There were no vitals filed for this visit.  Subjective Assessment - 10/09/17 1114    Subjective  Patient reports increased pain last night - did pull weeds over the weekend. Difficulty sleeping last night. Some better this am.     Currently in Pain?  Yes    Pain Score  3     Pain Location  Shoulder    Pain Orientation  Right    Pain Descriptors / Indicators  Sharp;Burning                       [facility] Adult PT Treatment/Exercise - 10/09/17 0001      Shoulder Exercises: Supine   Other Supine Exercises  hands behind head 20-30 sec x 2 reps     Other Supine Exercises  neural stretch 60 sec x 2 reps       Shoulder Exercises: Pulleys   Flexion  -- 10 sec hold x 10 reps     Scaption  -- 10 sec x 5 reps       Shoulder Exercises: Stretch   Internal Rotation Stretch  5 reps standing using cane/then pillowcase sliding hand up back 10s    External Rotation Stretch  5 reps;10 seconds standing with noodle with cane     Table Stretch - Flexion  10 seconds;5 reps    Other Shoulder Stretches  biceps stretch at table 30 sec x 3     Other Shoulder Stretches  extension with cane 5 sec x 5 reps        Moist Heat Therapy   Number Minutes Moist Heat  17 Minutes    Moist Heat Location  Shoulder Rt       Manual Therapy   Manual therapy comments  pt supine     Joint Mobilization  GH mobs in multiple planes     Soft tissue mobilization  deep tissue work through the Rt upper traps; leveator; pecs; teres; periscapular musculature    Myofascial Release  anterior shoulder/bicpes     Scapular Mobilization  Rt lateral and posterior     Passive ROM  PROM Rt shd flexion; scaption; extension; ER to pt tolerance              PT Education - 10/09/17 1143    Education provided  Yes    Education Details  HEP     Person(s) Educated  Patient    Methods  Explanation;Demonstration;Tactile cues;Verbal cues;Handout    Comprehension  Verbalized understanding;Returned demonstration;Verbal cues required;Tactile cues required  PT Long Term Goals - 10/09/17 1113      PT LONG TERM GOAL #1   Title  Improve posture and alignment with patient demonstrating improved posterior shoudler girdle engagement 11/07/17    Time  6    Period  Weeks    Status  On-going      PT LONG TERM GOAL #2   Title  Increase Rt shoulder AROM to equal or greater than AROM Lt shoulder 11/07/17    Time  6    Period  Weeks    Status  On-going      PT LONG TERM GOAL #3   Title  Decrease pain Rt shoulder by at least 75% with functional activities 11/08/17    Time  6    Period  Weeks    Status  On-going      PT LONG TERM GOAL #4   Title  Return to normal functional activities with independent HEP 11/07/17    Time  6    Period  Weeks    Status  On-going      PT LONG TERM GOAL #5   Title  Improve FOTO to </= 32% limitation 11/07/17    Time  6    Period  Weeks    Status  On-going            Plan - 10/09/17 1152    Clinical Impression Statement  Patient reports some increased pain yesterday and last night - some better this am. She is working on [employer] and added exercises today without difficulty. Added joint  mobilization; deep tissue work; PROM; stretching.     Rehab Potential  Good    PT Frequency  2x / week    PT Duration  6 weeks    PT Treatment/Interventions  Patient/family education;ADLs/Self Care Home Management;Cryotherapy;Electrical Stimulation;Iontophoresis 4mg /ml Dexamethasone;Moist Heat;Ultrasound;Dry needling;Manual techniques;Neuromuscular re-education;Therapeutic exercise;Therapeutic activities    PT Next Visit Plan  review HEP; continue manual work and PROM Rt upper quarter; trial of estim if needed due to hx of melanoma; continue education and postural correction - progress exercise as indicated focus on restoring ROM and posterior shoulder girdle strengthening     Consulted and Agree with Plan of Care  Patient       Patient will benefit from skilled therapeutic intervention in order to improve the following deficits and impairments:  Postural dysfunction, Improper body mechanics, Increased fascial restricitons, Increased muscle spasms, Hypomobility, Decreased range of motion, Decreased mobility, Impaired UE functional use  Visit Diagnosis: Chronic right shoulder pain  Abnormal posture  Stiffness of right shoulder, not elsewhere classified     Problem List Patient Active Problem List   Diagnosis Date Noted  . Adhesive capsulitis of right shoulder 09/19/2017  . Chronic right shoulder pain 06/05/2017    Sandra Stanton PT, MPH  10/09/2017, 11:56 AM  [facility] [address] [place], [place], [address] Phone: [phone]   Fax:  [phone]  Name: Sandra Stanton Date of Birth: Jul 29, 1960

## 2017-10-09 NOTE — Patient Instructions (Addendum)
Anterior Capsule Stretch, Standing    Stand holding stick behind back, hands palms up. Lift stick away from body as far as possible. Hold _5__ seconds. Repeat _10__ times per session. Do _2-3__ sessions per day.   Internal Rotator Cuff Stretch, Standing    Stand holding club behind body, one arm above head, other arm bent behind back. With upper hand, pull gently upward. Hold _10__ seconds. Repeat _5__ times per session. Do _2-3__ sessions per day.   External Rotator Cuff Stretch, Sitting (Passive)    Sit with elbow bent, forearm on table, palm down. Bend forward at waist until a stretch is felt in shoulder. Hold __10_ seconds.  Repeat _10__ times per session. Do _2-3__ sessions per day.   Internal Rotator Cuff Stretch, lying on back, hands behind head     Stand, hands clasped behind head. Pull elbows back as far as possible. Hold _30-60__ seconds. Repeat __2-3_ times per session. Do __2-3_ sessions per day.   Neurovascular: Median Nerve Stretch - Supine    Lie with neck supported, side-bent away from moving arm. Hold right arm out to side, elbow bent, thumb down, fingers and wrist bent back. Slowly straighten elbow as far as possible without pain. Hold for _60___ seconds. Repeat __2__ times per set. Do ____ sets day

## 2017-10-10 DIAGNOSIS — F320 Major depressive disorder, single episode, mild: Secondary | ICD-10-CM | POA: Diagnosis not present

## 2017-10-11 ENCOUNTER — Ambulatory Visit (INDEPENDENT_AMBULATORY_CARE_PROVIDER_SITE_OTHER): Payer: BLUE CROSS/BLUE SHIELD | Admitting: Rehabilitative and Restorative Service Providers"

## 2017-10-11 ENCOUNTER — Encounter: Payer: Self-pay | Admitting: Rehabilitative and Restorative Service Providers"

## 2017-10-11 DIAGNOSIS — G8929 Other chronic pain: Secondary | ICD-10-CM | POA: Diagnosis not present

## 2017-10-11 DIAGNOSIS — R293 Abnormal posture: Secondary | ICD-10-CM

## 2017-10-11 DIAGNOSIS — M25611 Stiffness of right shoulder, not elsewhere classified: Secondary | ICD-10-CM

## 2017-10-11 DIAGNOSIS — M25511 Pain in right shoulder: Secondary | ICD-10-CM

## 2017-10-11 NOTE — Therapy (Addendum)
[facility] [place], [place], [address] Phone: [phone]   Fax:  [phone]  Physical Therapy Treatment  Patient Details  Name: Sandra Stanton MRN: [id number] Date of Birth: 03-31-1961 Referring Provider: Dr Charlann Boxer    Encounter Date: 10/11/2017  PT End of Session - 10/11/17 1100    Visit Number  3    Number of Visits  12    Date for PT Re-Evaluation  11/07/17    PT Start Time  1100    PT Stop Time  1157    PT Time Calculation (min)  57 min    Activity Tolerance  Patient tolerated treatment well       History reviewed. No pertinent past medical history.  Past Surgical History:  Procedure Laterality Date  . BREAST BIOPSY  2008  . CESAREAN SECTION  [id number]  . GALLBLADDER SURGERY  2007    There were no vitals filed for this visit.  Subjective Assessment - 10/11/17 1101    Subjective  Patient reports continued soreness in the Rt shoulder - may be getting a little better but it is not going as fast as she wants it to. Seems she is sleeping a little better - shoulder still "doesn't like to lay down". She is working on her exercises at home.     Currently in Pain?  Yes    Pain Score  2     Pain Location  Shoulder    Pain Orientation  Right    Pain Descriptors / Indicators  Sharp;Burning    Pain Type  Chronic pain                       [facility] Adult PT Treatment/Exercise - 10/11/17 0001      Shoulder Exercises: Supine   Other Supine Exercises  hands behind head 20-30 sec x 2 reps       Shoulder Exercises: Standing   Extension  Strengthening;Right;Left;10 reps;Theraband    Theraband Level (Shoulder Extension)  Level 2 (Red)    Row  Strengthening;Right;Left;10 reps;Theraband    Theraband Level (Shoulder Row)  Level 2 (Red)    Retraction  Strengthening;Right;Left;10 reps;Theraband    Theraband Level (Shoulder Retraction)  Level 1 (Yellow)    Other Standing Exercises  axial  extension 10 sec x 5; scap squeeze 10 sec x 10; L's x 5; W's x 5 all with swim noodle       Shoulder Exercises: Pulleys   Flexion  -- 10 sec hold x 10 reps     Scaption  -- 10 sec x 5 reps       Shoulder Exercises: Stretch   Internal Rotation Stretch  5 reps standing using cane/then pillowcase sliding hand up back 10s    External Rotation Stretch  5 reps;10 seconds standing with noodle with cane     Table Stretch - Flexion  10 seconds;5 reps    Table Stretch -Flexion Limitations  standing arms on table - stepping back 15 sec hold x 5; stepping under therapy ball for shd flexion 15-20 sec hold x 5 reps     Other Shoulder Stretches  biceps stretch at table 30 sec x 3     Other Shoulder Stretches  extension with cane 5 sec x 5 reps     Internal Rotation Stretch Limitations  standing at door to stretch into ER at ~ 80 deg abduction       Moist Heat Therapy  Number Minutes Moist Heat  15 Minutes    Moist Heat Location  Shoulder Rt       Manual Therapy   Manual therapy comments  pt supine and sidelying    Joint Mobilization  GH mobs in multiple planes     Soft tissue mobilization  deep tissue work through the Rt upper traps; leveator; pecs; teres; periscapular musculature- working thorugh Rt lateral cervical musculature     Myofascial Release  anterior shoulder/bicpes     Scapular Mobilization  Pt sidelying for scap mobs and STM/ TP work through the shoulder girdle     Passive ROM  PROM Rt shd flexion; scaption; extension; ER to pt tolerance              PT Education - 10/11/17 1149    Education provided  Yes    Education Details  HEP    Person(s) Educated  Patient    Methods  Explanation;Demonstration;Tactile cues;Verbal cues;Handout    Comprehension  Verbalized understanding;Returned demonstration;Verbal cues required;Tactile cues required          PT Long Term Goals - 10/09/17 1113      PT LONG TERM GOAL #1   Title  Improve posture and alignment with patient  demonstrating improved posterior shoudler girdle engagement 11/07/17    Time  6    Period  Weeks    Status  On-going      PT LONG TERM GOAL #2   Title  Increase Rt shoulder AROM to equal or greater than AROM Lt shoulder 11/07/17    Time  6    Period  Weeks    Status  On-going      PT LONG TERM GOAL #3   Title  Decrease pain Rt shoulder by at least 75% with functional activities 11/08/17    Time  6    Period  Weeks    Status  On-going      PT LONG TERM GOAL #4   Title  Return to normal functional activities with independent HEP 11/07/17    Time  6    Period  Weeks    Status  On-going      PT LONG TERM GOAL #5   Title  Improve FOTO to </= 32% limitation 11/07/17    Time  6    Period  Weeks    Status  On-going            Plan - 10/11/17 1104    Clinical Impression Statement  Patient reports continued soreness and pain. She is making progress with PROM and stretch and tolerates the PROM by PT fairly well. Gradually progressing toward goals of therapy. Expected progress for adhesive capsulitris.     Rehab Potential  Good    Clinical Impairments Affecting Rehab Potential  chronic nature of dysfunction     PT Frequency  2x / week    PT Duration  6 weeks    PT Treatment/Interventions  Patient/family education;ADLs/Self Care Home Management;Cryotherapy;Electrical Stimulation;Iontophoresis 1m/ml Dexamethasone;Moist Heat;Ultrasound;Dry needling;Manual techniques;Neuromuscular re-education;Therapeutic exercise;Therapeutic activities    PT Next Visit Plan  review HEP; continue manual work and PROM Rt upper quarter; trial of estim if needed due to hx of melanoma; continue education and postural correction - progress exercise as indicated focus on restoring ROM and posterior shoulder girdle strengthening     Consulted and Agree with Plan of Care  Patient       Patient will benefit from skilled therapeutic intervention in order to improve the  following deficits and impairments:  Postural  dysfunction, Improper body mechanics, Increased fascial restricitons, Increased muscle spasms, Hypomobility, Decreased range of motion, Decreased mobility, Impaired UE functional use  Visit Diagnosis: Chronic right shoulder pain  Abnormal posture  Stiffness of right shoulder, not elsewhere classified     Problem List Patient Active Problem List   Diagnosis Date Noted  . Adhesive capsulitis of right shoulder 09/19/2017  . Chronic right shoulder pain 06/05/2017    Celyn Nilda Simmer PT, MPH  10/11/2017, 11:59 AM  [facility] [place], [place], [address] Phone: [phone]   Fax:  [phone]  Name: Sandra Stanton MRN: [id number] Date of Birth: 06-10-1960  PHYSICAL THERAPY DISCHARGE SUMMARY  Visits from Start of Care: 3  Current functional level related to goals / functional outcomes: See last progress note for discharge status   Remaining deficits: Unknown    Education / Equipment: HEP  Plan: Patient agrees to discharge.  Patient goals were not met. Patient is being discharged due to not returning since the last visit.  ?????     Celyn P. Helene Kelp PT, MPH 11/01/17 12:08 PM

## 2017-10-11 NOTE — Patient Instructions (Addendum)
Resisted External Rotation: in Neutral - Bilateral   PALMS UP Sit or stand, tubing in both hands, elbows at sides, bent to 90, forearms forward. Pinch shoulder blades together and rotate forearms out. Keep elbows at sides. Repeat __10__ times per set. Do _2-3___ sets per session. Do _2-3___ sessions per day.   Low Row: Standing   Face anchor, feet shoulder width apart. Palms up, pull arms back, squeezing shoulder blades down and back. Repeat 10__ times per set. Do 2-3__ sets per session. Do 2-3__ sessions per week. Anchor Height: Waist   Strengthening: Resisted Extension   Hold tubing in right hand, arm forward. Pull arm back, elbow straight. Repeat _10___ times per set. Do 2-3____ sets per session. Do 2-3____ sessions per day.    Flexors Stretch, Standing    Stand about a thigh's length from table. Grip edges of table. Bend knees until stretch is felt under shoulder blades in chest. Hold __10-20_ seconds. Repeat _3-5__ times per session. Do _2-3__ sessions per day.   Ball on wall - stepping under large ball on wall  20-30 sec x 3-5 reps

## 2017-10-17 ENCOUNTER — Ambulatory Visit (INDEPENDENT_AMBULATORY_CARE_PROVIDER_SITE_OTHER): Payer: BLUE CROSS/BLUE SHIELD | Admitting: Family Medicine

## 2017-10-17 ENCOUNTER — Ambulatory Visit: Payer: Self-pay

## 2017-10-17 ENCOUNTER — Encounter: Payer: Self-pay | Admitting: Family Medicine

## 2017-10-17 VITALS — BP 126/84 | HR 98 | Ht 66.0 in | Wt 148.0 lb

## 2017-10-17 DIAGNOSIS — M25511 Pain in right shoulder: Principal | ICD-10-CM

## 2017-10-17 DIAGNOSIS — M7501 Adhesive capsulitis of right shoulder: Secondary | ICD-10-CM | POA: Diagnosis not present

## 2017-10-17 DIAGNOSIS — H35363 Drusen (degenerative) of macula, bilateral: Secondary | ICD-10-CM | POA: Diagnosis not present

## 2017-10-17 DIAGNOSIS — G8929 Other chronic pain: Secondary | ICD-10-CM | POA: Diagnosis not present

## 2017-10-17 DIAGNOSIS — D3122 Benign neoplasm of left retina: Secondary | ICD-10-CM | POA: Diagnosis not present

## 2017-10-17 DIAGNOSIS — H5213 Myopia, bilateral: Secondary | ICD-10-CM | POA: Diagnosis not present

## 2017-10-17 NOTE — Progress Notes (Signed)
Sandra ScaleZach Stanton D.O. [facility] [address] [place], [place] [address] Phone: [phone] Subjective:     CC: Right shoulder pain follow-up  BJY:[id number]:Subjective  Sandra StainJennifer C Stanton is a 57 y.o. female coming in with complaint of right shoulder pain.  Patient was found to have more of an adhesive capsulitis with an MRI showing only a very mild sprain of the rotator cuff.  Patient has been doing physical therapy and feels like she is not getting significant benefit and is causing more pain.  Patient states that is waking up at night.  States that she still has loss of range of motion.  Affecting daily activities even leg dressing.     No past medical history on file. Past Surgical History:  Procedure Laterality Date  . BREAST BIOPSY  2008  . CESAREAN SECTION  [id number],1990  . GALLBLADDER SURGERY  2007   Social History   Socioeconomic History  . Marital status: Married    Spouse name: Not on file  . Number of children: Not on file  . Years of education: Not on file  . Highest education level: Not on file  Occupational History  . Not on file  Social Needs  . Financial resource strain: Not on file  . Food insecurity:    Worry: Not on file    Inability: Not on file  . Transportation needs:    Medical: Not on file    Non-medical: Not on file  Tobacco Use  . Smoking status: Former Games developermoker  . Smokeless tobacco: Never Used  Substance and Sexual Activity  . Alcohol use: Not on file  . Drug use: Not on file  . Sexual activity: Not on file  Lifestyle  . Physical activity:    Days per week: Not on file    Minutes per session: Not on file  . Stress: Not on file  Relationships  . Social connections:    Talks on phone: Not on file    Gets together: Not on file    Attends religious service: Not on file    Active member of club or organization: Not on file    Attends meetings of clubs or organizations: Not on file    Relationship status: Not on file  Other Topics Concern  .  Not on file  Social History Narrative  . Not on file   Not on File no known drug allergies No family history on file.  No family history of autoimmune   Past medical history, social, surgical and family history all reviewed in electronic medical record.  No pertanent information unless stated regarding to the chief complaint.   Review of Systems:Review of systems updated and as accurate as of 10/17/17  No headache, visual changes, nausea, vomiting, diarrhea, constipation, dizziness, abdominal pain, skin rash, fevers, chills, night sweats, weight loss, swollen lymph nodes, body aches, joint swelling,, chest pain, shortness of breath, mood changes.  Positive muscle aches  Objective  Blood pressure 126/84, pulse 98, height 5\' 6"  (1.676 m), weight 148 lb (67.1 kg), SpO2 98 %. Systems examined below as of 10/17/17   General: No apparent distress alert and oriented x3 mood and affect normal, dressed appropriately.  HEENT: Pupils equal, extraocular movements intact  Respiratory: Patient's speak in full sentences and does not appear short of breath  Cardiovascular: No lower extremity edema, non tender, no erythema  Skin: Warm dry intact with no signs of infection or rash on extremities or on axial skeleton.  Abdomen: Soft  nontender  Neuro: Cranial nerves II through XII are intact, neurovascularly intact in all extremities with 2+ DTRs and 2+ pulses.  Lymph: No lymphadenopathy of posterior or anterior cervical chain or axillae bilaterally.  Gait normal with good balance and coordination.  MSK:  Non tender with full range of motion and good stability and symmetric strength and tone of  elbows, wrist, hip, knee and ankles bilaterally.  Right shoulder exam shows the patient has loss of range of motion in all planes.  Still using 20 degrees of forward flexion, has minimal external rotation has internal rotation to sacrum.  Rotator cuff strength is intact and 4+ out of 5 compared to the contralateral  side Contralateral shoulder unremarkable  Procedure: Real-time Ultrasound Guided Injection of right glenohumeral joint Device: GE Logiq Q7  Ultrasound guided injection is preferred based studies that show increased duration, increased effect, greater accuracy, decreased procedural pain, increased response rate with ultrasound guided versus blind injection.  Verbal informed consent obtained.  Time-out conducted.  Noted no overlying erythema, induration, or other signs of local infection.  Skin prepped in a sterile fashion.  Local anesthesia: Topical Ethyl chloride.  With sterile technique and under real time ultrasound guidance:  Joint visualized.  23g 1  inch needle inserted posterior approach. Pictures taken for needle placement. Patient did have injection of 2 cc of 1% lidocaine, 2 cc of 0.5% Marcaine, and 1.0 cc of Kenalog 40 mg/dL. Completed without difficulty  Pain immediately resolved suggesting accurate placement of the medication.  Advised to call if fevers/chills, erythema, induration, drainage, or persistent bleeding.  Images permanently stored and available for review in the ultrasound unit.  Impression: Technically successful ultrasound guided injection.    Impression and Recommendations:     This case required medical decision making of moderate complexity.      Note: This dictation was prepared with Dragon dictation along with smaller phrase technology. Any transcriptional errors that result from this process are unintentional.

## 2017-10-17 NOTE — Assessment & Plan Note (Signed)
Patient given injection today.  Tolerated procedure well.  Discussed icing regimen and home exercises.  Discussed which activities to do which wants to avoid.  Patient is to increase activity as tolerated.  Follow-up with me again in 6 weeks

## 2017-10-17 NOTE — Patient Instructions (Signed)
Good to see you  Gustavus Bryantce is your friend.  Injected the shoulder KEEP PUSHING IT  Take 3 days off then start your exercises U p to you if you wan to to continue the PT  See me again in 6 weeks

## 2017-10-18 ENCOUNTER — Encounter: Payer: BLUE CROSS/BLUE SHIELD | Admitting: Rehabilitative and Restorative Service Providers"

## 2017-10-23 ENCOUNTER — Encounter: Payer: BLUE CROSS/BLUE SHIELD | Admitting: Rehabilitative and Restorative Service Providers"

## 2017-10-24 DIAGNOSIS — F320 Major depressive disorder, single episode, mild: Secondary | ICD-10-CM | POA: Diagnosis not present

## 2017-10-25 ENCOUNTER — Encounter: Payer: BLUE CROSS/BLUE SHIELD | Admitting: Rehabilitative and Restorative Service Providers"

## 2017-10-30 ENCOUNTER — Encounter: Payer: BLUE CROSS/BLUE SHIELD | Admitting: Rehabilitative and Restorative Service Providers"

## 2017-11-01 ENCOUNTER — Encounter: Payer: BLUE CROSS/BLUE SHIELD | Admitting: Rehabilitative and Restorative Service Providers"

## 2017-11-07 DIAGNOSIS — B009 Herpesviral infection, unspecified: Secondary | ICD-10-CM | POA: Diagnosis not present

## 2017-11-07 DIAGNOSIS — R232 Flushing: Secondary | ICD-10-CM | POA: Diagnosis not present

## 2017-11-07 DIAGNOSIS — F320 Major depressive disorder, single episode, mild: Secondary | ICD-10-CM | POA: Diagnosis not present

## 2017-11-07 DIAGNOSIS — E039 Hypothyroidism, unspecified: Secondary | ICD-10-CM | POA: Diagnosis not present

## 2017-11-07 DIAGNOSIS — F329 Major depressive disorder, single episode, unspecified: Secondary | ICD-10-CM | POA: Diagnosis not present

## 2017-11-14 DIAGNOSIS — F988 Other specified behavioral and emotional disorders with onset usually occurring in childhood and adolescence: Secondary | ICD-10-CM | POA: Diagnosis not present

## 2017-11-14 DIAGNOSIS — R140 Abdominal distension (gaseous): Secondary | ICD-10-CM | POA: Diagnosis not present

## 2017-11-14 DIAGNOSIS — E7212 Methylenetetrahydrofolate reductase deficiency: Secondary | ICD-10-CM | POA: Diagnosis not present

## 2017-11-14 DIAGNOSIS — E039 Hypothyroidism, unspecified: Secondary | ICD-10-CM | POA: Diagnosis not present

## 2017-11-14 DIAGNOSIS — R7989 Other specified abnormal findings of blood chemistry: Secondary | ICD-10-CM | POA: Diagnosis not present

## 2017-11-14 DIAGNOSIS — N959 Unspecified menopausal and perimenopausal disorder: Secondary | ICD-10-CM | POA: Diagnosis not present

## 2017-11-21 DIAGNOSIS — H35363 Drusen (degenerative) of macula, bilateral: Secondary | ICD-10-CM | POA: Diagnosis not present

## 2017-11-22 DIAGNOSIS — F320 Major depressive disorder, single episode, mild: Secondary | ICD-10-CM | POA: Diagnosis not present

## 2017-11-27 NOTE — Progress Notes (Signed)
Tawana ScaleZach Smith D.O. [facility] [address] [place], [place] [address] Phone: [phone] Subjective:      CC: Right shoulder pain follow-up  MWN:[id number]:Subjective  Sandra Stanton is a 57 y.o. female coming in with complaint of shoulder pain. States that her shoulder is getting better. She believes it is better than last time.  States 85% better.  Still with certain movements still has limited range of motion in different planes.  Patient did have an MRI only showing adhesive capsulitis.  Patient states that she is thinking she is making improvement.     No past medical history on file. Past Surgical History:  Procedure Laterality Date  . BREAST BIOPSY  2008  . CESAREAN SECTION  [id number],1990  . GALLBLADDER SURGERY  2007   Social History   Socioeconomic History  . Marital status: Married    Spouse name: Not on file  . Number of children: Not on file  . Years of education: Not on file  . Highest education level: Not on file  Occupational History  . Not on file  Social Needs  . Financial resource strain: Not on file  . Food insecurity:    Worry: Not on file    Inability: Not on file  . Transportation needs:    Medical: Not on file    Non-medical: Not on file  Tobacco Use  . Smoking status: Former Games developermoker  . Smokeless tobacco: Never Used  Substance and Sexual Activity  . Alcohol use: Not on file  . Drug use: Not on file  . Sexual activity: Not on file  Lifestyle  . Physical activity:    Days per week: Not on file    Minutes per session: Not on file  . Stress: Not on file  Relationships  . Social connections:    Talks on phone: Not on file    Gets together: Not on file    Attends religious service: Not on file    Active member of club or organization: Not on file    Attends meetings of clubs or organizations: Not on file    Relationship status: Not on file  Other Topics Concern  . Not on file  Social History Narrative  . Not on file   Not on File No  family history on file.   Past medical history, social, surgical and family history all reviewed in electronic medical record.  No pertanent information unless stated regarding to the chief complaint.   Review of Systems:Review of systems updated and as accurate as of 11/28/17  No headache, visual changes, nausea, vomiting, diarrhea, constipation, dizziness, abdominal pain, skin rash, fevers, chills, night sweats, weight loss, swollen lymph nodes, body aches, joint swelling, muscle aches, chest pain, shortness of breath, mood changes.   Objective  Blood pressure 104/74, pulse 73, height 5\' 6"  (1.676 m), weight 148 lb (67.1 kg), SpO2 98 %. Systems examined below as of 11/28/17   General: No apparent distress alert and oriented x3 mood and affect normal, dressed appropriately.  HEENT: Pupils equal, extraocular movements intact  Respiratory: Patient's speak in full sentences and does not appear short of breath  Cardiovascular: No lower extremity edema, non tender, no erythema  Skin: Warm dry intact with no signs of infection or rash on extremities or on axial skeleton.  Abdomen: Soft nontender  Neuro: Cranial nerves II through XII are intact, neurovascularly intact in all extremities with 2+ DTRs and 2+ pulses.  Lymph: No lymphadenopathy of posterior or anterior  cervical chain or axillae bilaterally.  Gait normal with good balance and coordination.  MSK:  Non tender with full range of motion and good stability and symmetric strength and tone of  elbows, wrist, hip, knee and ankles bilaterally.  Right shoulder exam shows the patient still has some limited range of motion lacking external rotation still fairly significantly.  Patient still lacks last 5 degrees of forward flexion.  Internal rotation to L4.   Impression and Recommendations:     This case required medical decision making of moderate complexity.      Note: This dictation was prepared with Dragon dictation along with smaller  phrase technology. Any transcriptional errors that result from this process are unintentional.

## 2017-11-28 ENCOUNTER — Ambulatory Visit (INDEPENDENT_AMBULATORY_CARE_PROVIDER_SITE_OTHER): Payer: BLUE CROSS/BLUE SHIELD | Admitting: Family Medicine

## 2017-11-28 ENCOUNTER — Encounter: Payer: Self-pay | Admitting: Family Medicine

## 2017-11-28 DIAGNOSIS — M7501 Adhesive capsulitis of right shoulder: Secondary | ICD-10-CM

## 2017-11-28 NOTE — Assessment & Plan Note (Signed)
Improvement at the moment.  Discussed icing regimen and home exercises.  Discussed which activities of doing which wants to avoid.  Increase activity slowly over the course of next several days.  Follow-up again in 4 to 8 weeks

## 2017-11-28 NOTE — Patient Instructions (Signed)
Good to see you  Sandra Stanton is your friend.  Stay active Keep working on moving the shoulder See me again in 6-8 week for one more time and we will inject if need be!

## 2017-12-05 DIAGNOSIS — F320 Major depressive disorder, single episode, mild: Secondary | ICD-10-CM | POA: Diagnosis not present

## 2017-12-31 ENCOUNTER — Other Ambulatory Visit: Payer: Self-pay | Admitting: Family Medicine

## 2018-01-02 DIAGNOSIS — F320 Major depressive disorder, single episode, mild: Secondary | ICD-10-CM | POA: Diagnosis not present

## 2018-01-14 NOTE — Progress Notes (Signed)
[name] [address] [place], [place] [address] Phone: [phone] Subjective:   Sandra Stanton, am serving as a scribe for Dr. Antoine Primas.  I'm seeing this patient by the request  of:    CC: Right shoulder pain follow-up  FUX:[id number]  Sandra Stanton is a 57 y.o. female coming in with complaint of shoulder pain. She does note that her range is improving with bar active passive range of motion. She feels pain with overhead motions. Does perform some straight arm raises. Tightness with IR when she puts her hands on her hips.      No past medical history on file. Past Surgical History:  Procedure Laterality Date  . BREAST BIOPSY  2008  . CESAREAN SECTION  [id number]  . GALLBLADDER SURGERY  2007   Social History   Socioeconomic History  . Marital status: Married    Spouse name: Not on file  . Number of children: Not on file  . Years of education: Not on file  . Highest education level: Not on file  Occupational History  . Not on file  Social Needs  . Financial resource strain: Not on file  . Food insecurity:    Worry: Not on file    Inability: Not on file  . Transportation needs:    Medical: Not on file    Non-medical: Not on file  Tobacco Use  . Smoking status: Former Games developer  . Smokeless tobacco: Never Used  Substance and Sexual Activity  . Alcohol use: Not on file  . Drug use: Not on file  . Sexual activity: Not on file  Lifestyle  . Physical activity:    Days per week: Not on file    Minutes per session: Not on file  . Stress: Not on file  Relationships  . Social connections:    Talks on phone: Not on file    Gets together: Not on file    Attends religious service: Not on file    Active member of club or organization: Not on file    Attends meetings of clubs or organizations: Not on file    Relationship status: Not on file  Other Topics Concern  . Not on file  Social History Narrative  . Not on file   Not on  File No family history on file.       Current Outpatient Medications (Other):  .  gabapentin (NEURONTIN) 100 MG capsule, TAKE 2 CAPSULES(200 MG) BY MOUTH AT BEDTIME .  Vitamin D, Ergocalciferol, (DRISDOL) 50000 units CAPS capsule, Take 1 capsule (50,000 Units total) by mouth every 7 (seven) days.    Past medical history, social, surgical and family history all reviewed in electronic medical record.  No pertanent information unless stated regarding to the chief complaint.   Review of Systems:  No headache, visual changes, nausea, vomiting, diarrhea, constipation, dizziness, abdominal pain, skin rash, fevers, chills, night sweats, weight loss, swollen lymph nodes, body aches, joint swelling, muscle aches, chest pain, shortness of breath, mood changes.   Objective  Blood pressure 110/74, pulse 85, height 5\' 6"  (1.676 m), weight 150 lb (68 kg), SpO2 98 %.    General: No apparent distress alert and oriented x3 mood and affect normal, dressed appropriately.  HEENT: Pupils equal, extraocular movements intact  Respiratory: Patient's speak in full sentences and does not appear short of breath  Cardiovascular: No lower extremity edema, non tender, no erythema  Skin: Warm dry intact with no signs  of infection or rash on extremities or on axial skeleton.  Abdomen: Soft nontender  Neuro: Cranial nerves II through XII are intact, neurovascularly intact in all extremities with 2+ DTRs and 2+ pulses.  Lymph: No lymphadenopathy of posterior or anterior cervical chain or axillae bilaterally.  Gait normal with good balance and coordination.  MSK:  Non tender with full range of motion and good stability and symmetric strength and tone of  elbows, wrist, hip, knee and ankles bilaterally.  Patient's right shoulder has near full range of motion lacking the last 10 degrees of external rotation but otherwise doing remarkably well.  Rotator cuff strength intact.   Impression and Recommendations:       The above documentation has been reviewed and is accurate and complete Judi Saa, DO       Note: This dictation was prepared with Dragon dictation along with smaller phrase technology. Any transcriptional errors that result from this process are unintentional.

## 2018-01-16 ENCOUNTER — Encounter: Payer: Self-pay | Admitting: Family Medicine

## 2018-01-16 ENCOUNTER — Ambulatory Visit (INDEPENDENT_AMBULATORY_CARE_PROVIDER_SITE_OTHER): Payer: BLUE CROSS/BLUE SHIELD | Admitting: Family Medicine

## 2018-01-16 DIAGNOSIS — F320 Major depressive disorder, single episode, mild: Secondary | ICD-10-CM | POA: Diagnosis not present

## 2018-01-16 DIAGNOSIS — M7501 Adhesive capsulitis of right shoulder: Secondary | ICD-10-CM | POA: Diagnosis not present

## 2018-01-16 NOTE — Assessment & Plan Note (Signed)
Doing much better overall.  Discussed that we could repeat injection if necessary.  Patient wants to continue conservative therapy and follow-up again in 2 months.

## 2018-01-16 NOTE — Patient Instructions (Signed)
Proud of you  Stay active Keep pushing See me again in 2 months if not perfect

## 2018-01-30 ENCOUNTER — Other Ambulatory Visit: Payer: Self-pay | Admitting: Family Medicine

## 2018-01-30 DIAGNOSIS — F320 Major depressive disorder, single episode, mild: Secondary | ICD-10-CM | POA: Diagnosis not present

## 2018-02-13 DIAGNOSIS — F320 Major depressive disorder, single episode, mild: Secondary | ICD-10-CM | POA: Diagnosis not present

## 2018-02-27 DIAGNOSIS — F320 Major depressive disorder, single episode, mild: Secondary | ICD-10-CM | POA: Diagnosis not present

## 2018-03-01 ENCOUNTER — Other Ambulatory Visit: Payer: Self-pay | Admitting: Family Medicine

## 2018-03-01 NOTE — Telephone Encounter (Signed)
Refill done.  

## 2018-03-13 DIAGNOSIS — F320 Major depressive disorder, single episode, mild: Secondary | ICD-10-CM | POA: Diagnosis not present

## 2018-03-27 DIAGNOSIS — F320 Major depressive disorder, single episode, mild: Secondary | ICD-10-CM | POA: Diagnosis not present

## 2018-04-10 DIAGNOSIS — F320 Major depressive disorder, single episode, mild: Secondary | ICD-10-CM | POA: Diagnosis not present

## 2018-04-11 DIAGNOSIS — Z08 Encounter for follow-up examination after completed treatment for malignant neoplasm: Secondary | ICD-10-CM | POA: Diagnosis not present

## 2018-04-11 DIAGNOSIS — L821 Other seborrheic keratosis: Secondary | ICD-10-CM | POA: Diagnosis not present

## 2018-04-11 DIAGNOSIS — Z85820 Personal history of malignant melanoma of skin: Secondary | ICD-10-CM | POA: Diagnosis not present

## 2018-04-24 DIAGNOSIS — F320 Major depressive disorder, single episode, mild: Secondary | ICD-10-CM | POA: Diagnosis not present

## 2018-05-06 NOTE — Progress Notes (Signed)
Tawana ScaleZach Smith D.O. [facility] [address] [place], [place] [address] Phone: [phone] Subjective:    [name], Valerie Wolf, am serving as a scribe for Dr. Antoine PrimasZachary Smith.   CC: Shoulder pain follow-up  FAO:[id number]:Subjective  Sandra StainJennifer C Buenger is a 57 y.o. female coming in with complaint of shoulder pain. Patient has been stretching a lot since last visit. Was feeling good but in November she started to have pain in the right shoulder. Feels like she can only get to end range after her shoulder catches. Pain in deltoid and deltoid tuberosity. Does note numbness with doorway stretch at 90 degrees.     Patient has been seen previously for the shoulder.  We injected her shoulder back in February 25.  Had an MR arthrogram due to her not improving with a mild sprain.  Noted to have what seemed to be early adhesive capsulitis and repeat injection in June 2019.    No past medical history on file. Past Surgical History:  Procedure Laterality Date  . BREAST BIOPSY  2008  . CESAREAN SECTION  [id number],1990  . GALLBLADDER SURGERY  2007   Social History   Socioeconomic History  . Marital status: Married    Spouse name: Not on file  . Number of children: Not on file  . Years of education: Not on file  . Highest education level: Not on file  Occupational History  . Not on file  Social Needs  . Financial resource strain: Not on file  . Food insecurity:    Worry: Not on file    Inability: Not on file  . Transportation needs:    Medical: Not on file    Non-medical: Not on file  Tobacco Use  . Smoking status: Former Games developermoker  . Smokeless tobacco: Never Used  Substance and Sexual Activity  . Alcohol use: Not on file  . Drug use: Not on file  . Sexual activity: Not on file  Lifestyle  . Physical activity:    Days per week: Not on file    Minutes per session: Not on file  . Stress: Not on file  Relationships  . Social connections:    Talks on phone: Not on file    Gets together: Not on  file    Attends religious service: Not on file    Active member of club or organization: Not on file    Attends meetings of clubs or organizations: Not on file    Relationship status: Not on file  Other Topics Concern  . Not on file  Social History Narrative  . Not on file   Not on File No family history on file.       Current Outpatient Medications (Other):  .  gabapentin (NEURONTIN) 100 MG capsule, TAKE 2 CAPSULES(200 MG) BY MOUTH AT BEDTIME .  Vitamin D, Ergocalciferol, (DRISDOL) 50000 units CAPS capsule, Take 1 capsule (50,000 Units total) by mouth every 7 (seven) days.    Past medical history, social, surgical and family history all reviewed in electronic medical record.  No pertanent information unless stated regarding to the chief complaint.   Review of Systems:  No headache, visual changes, nausea, vomiting, diarrhea, constipation, dizziness, abdominal pain, skin rash, fevers, chills, night sweats, weight loss, swollen lymph nodes, body aches, joint swelling, chest pain, shortness of breath, mood changes.  Positive muscle aches  Objective  Blood pressure 102/72, pulse 80, height 5\' 6"  (1.676 m), weight 151 lb (68.5 kg), SpO2 97 %.  General: No apparent distress alert and oriented x3 mood and affect normal, dressed appropriately.  HEENT: Pupils equal, extraocular movements intact  Respiratory: Patient's speak in full sentences and does not appear short of breath  Cardiovascular: No lower extremity edema, non tender, no erythema  Skin: Warm dry intact with no signs of infection or rash on extremities or on axial skeleton.  Abdomen: Soft nontender  Neuro: Cranial nerves II through XII are intact, neurovascularly intact in all extremities with 2+ DTRs and 2+ pulses.  Lymph: No lymphadenopathy of posterior or anterior cervical chain or axillae bilaterally.  Gait mild antalgic MSK:  Non tender with full range of motion and good stability and symmetric strength and tone  of  elbows, wrist, hip, knee and ankles bilaterally.  Shoulder: Right shoulder Inspection reveals no abnormalities, atrophy or asymmetry. Diffusely uncomfortable but no true pain Patient does have some mild limited range of motion with the last 5 degrees of external rotation and internal rotation. Rotator cuff strength normal throughout. Positive impingement Speeds and Yergason's tests normal. No labral pathology noted with negative Obrien's, negative clunk and good stability. Normal scapular function observed. No painful arc and no drop arm sign. No apprehension sign Contralateral shoulder unremarkable  Limited musculoskeletal ultrasound was performed and interpreted by Judi SaaZachary M Smith  Limited ultrasound of patient's right shoulder still shows that patient has some thickness of the capsule anterior and superior still mostly around the supraspinatus.  Mild tendinopathy also noted at the supinates in the subscapularis Impression: Resolving adhesive capsulitis with mild reactive tendinitis   Impression and Recommendations:      The above documentation has been reviewed and is accurate and complete Judi SaaZachary M Smith, DO       Note: This dictation was prepared with Dragon dictation along with smaller phrase technology. Any transcriptional errors that result from this process are unintentional.

## 2018-05-07 ENCOUNTER — Ambulatory Visit (INDEPENDENT_AMBULATORY_CARE_PROVIDER_SITE_OTHER): Payer: BLUE CROSS/BLUE SHIELD | Admitting: Family Medicine

## 2018-05-07 ENCOUNTER — Ambulatory Visit: Payer: Self-pay

## 2018-05-07 ENCOUNTER — Encounter: Payer: Self-pay | Admitting: Family Medicine

## 2018-05-07 VITALS — BP 102/72 | HR 80 | Ht 66.0 in | Wt 151.0 lb

## 2018-05-07 DIAGNOSIS — G8929 Other chronic pain: Secondary | ICD-10-CM | POA: Diagnosis not present

## 2018-05-07 DIAGNOSIS — F320 Major depressive disorder, single episode, mild: Secondary | ICD-10-CM | POA: Diagnosis not present

## 2018-05-07 DIAGNOSIS — M25511 Pain in right shoulder: Secondary | ICD-10-CM

## 2018-05-07 DIAGNOSIS — M7501 Adhesive capsulitis of right shoulder: Secondary | ICD-10-CM | POA: Diagnosis not present

## 2018-05-07 NOTE — Assessment & Plan Note (Signed)
Patient is 6 months out from the injection.  Declined another injection today.  Continue the conservative therapy.  Patient has made improvement and has very mild limited range of motion difficulties.  Follow-up with me again in 4 weeks and if not better will consider the repeat injection

## 2018-05-07 NOTE — Patient Instructions (Signed)
God to see you  See me again in 6-8 weeks No more stretching for 2 weeks Keep hands within peripheral vision  Happy New Year!

## 2018-05-22 DIAGNOSIS — F329 Major depressive disorder, single episode, unspecified: Secondary | ICD-10-CM | POA: Diagnosis not present

## 2018-05-22 DIAGNOSIS — R232 Flushing: Secondary | ICD-10-CM | POA: Diagnosis not present

## 2018-05-22 DIAGNOSIS — E039 Hypothyroidism, unspecified: Secondary | ICD-10-CM | POA: Diagnosis not present

## 2018-05-22 DIAGNOSIS — B009 Herpesviral infection, unspecified: Secondary | ICD-10-CM | POA: Diagnosis not present

## 2018-05-30 DIAGNOSIS — F320 Major depressive disorder, single episode, mild: Secondary | ICD-10-CM | POA: Diagnosis not present

## 2018-06-18 DIAGNOSIS — J069 Acute upper respiratory infection, unspecified: Secondary | ICD-10-CM | POA: Diagnosis not present

## 2018-06-18 DIAGNOSIS — B9789 Other viral agents as the cause of diseases classified elsewhere: Secondary | ICD-10-CM | POA: Diagnosis not present

## 2018-06-18 DIAGNOSIS — J309 Allergic rhinitis, unspecified: Secondary | ICD-10-CM | POA: Diagnosis not present

## 2018-06-25 NOTE — Progress Notes (Signed)
[name] [address] [place], [place] [address] Phone: [phone] Subjective:   [name], am serving as a scribe for Dr. Antoine Primas.   CC: Right shoulder pain  XTK:[id number]   05/08/2019: Patient is 6 months out from the injection.  Declined another injection today.  Continue the conservative therapy.  Patient has made improvement and has very mild limited range of motion difficulties.  Follow-up with me again in 4 weeks and if not better will consider the repeat injection  Update 06/26/2018: Sandra Stanton is a 58 y.o. female coming in with complaint of right shoulder pain. Patient has pain with IR and flexion. Does feel like she has progressed. Is wondering though if she will ever be back to 100%.  Known history of frozen shoulder.  Patient is also having knee pain when she is doing down hill. Has been having pain for 1.5 months.  Seems to be in the anterior aspect of the knee.  Some mild crepitation notes.  Patient denies long when it is giving out on him.  Does not remember any specific injury.      No past medical history on file. Past Surgical History:  Procedure Laterality Date  . BREAST BIOPSY  2008  . CESAREAN SECTION  [id number]  . GALLBLADDER SURGERY  2007   Social History   Socioeconomic History  . Marital status: Married    Spouse name: Not on file  . Number of children: Not on file  . Years of education: Not on file  . Highest education level: Not on file  Occupational History  . Not on file  Social Needs  . Financial resource strain: Not on file  . Food insecurity:    Worry: Not on file    Inability: Not on file  . Transportation needs:    Medical: Not on file    Non-medical: Not on file  Tobacco Use  . Smoking status: Former Games developer  . Smokeless tobacco: Never Used  Substance and Sexual Activity  . Alcohol use: Not on file  . Drug use: Not on file  . Sexual activity: Not on file  Lifestyle  .  Physical activity:    Days per week: Not on file    Minutes per session: Not on file  . Stress: Not on file  Relationships  . Social connections:    Talks on phone: Not on file    Gets together: Not on file    Attends religious service: Not on file    Active member of club or organization: Not on file    Attends meetings of clubs or organizations: Not on file    Relationship status: Not on file  Other Topics Concern  . Not on file  Social History Narrative  . Not on file   Not on File No family history on file.       Current Outpatient Medications (Other):  .  gabapentin (NEURONTIN) 100 MG capsule, TAKE 2 CAPSULES(200 MG) BY MOUTH AT BEDTIME .  Vitamin D, Ergocalciferol, (DRISDOL) 50000 units CAPS capsule, Take 1 capsule (50,000 Units total) by mouth every 7 (seven) days.    Past medical history, social, surgical and family history all reviewed in electronic medical record.  No pertanent information unless stated regarding to the chief complaint.   Review of Systems:  No headache, visual changes, nausea, vomiting, diarrhea, constipation, dizziness, abdominal pain, skin rash, fevers, chills, night sweats, weight loss, swollen lymph nodes, body aches,  joint swelling, muscle aches, chest pain, shortness of breath, mood changes.   Objective  There were no vitals taken for this visit. Systems examined below as of    General: No apparent distress alert and oriented x3 mood and affect normal, dressed appropriately.  HEENT: Pupils equal, extraocular movements intact  Respiratory: Patient's speak in full sentences and does not appear short of breath  Cardiovascular: No lower extremity edema, non tender, no erythema  Skin: Warm dry intact with no signs of infection or rash on extremities or on axial skeleton.  Abdomen: Soft nontender  Neuro: Cranial nerves II through XII are intact, neurovascularly intact in all extremities with 2+ DTRs and 2+ pulses.  Lymph: No lymphadenopathy  of posterior or anterior cervical chain or axillae bilaterally.  Gait normal with good balance and coordination.  MSK:  Non tender with full range of motion and good stability and symmetric strength and tone of , elbows, wrist, hip, and ankles bilaterally.  Right shoulder exam shows the patient has full forward flexion, still limited still lacks 5 degrees of internal rotation in the last 5 degrees of external rotation.  Mild positive impingement.  Full strength of the rotator cuff noted.  Contralateral shoulder unremarkable  Left knee exam does have some mild lateral tracking of the kneecap.  Crepitus noted.  Patient does have a positive patella grind.  Full range of motion.  All ligaments appear to be intact.  [id number]; 15 additional minutes spent for Therapeutic exercises as stated in above notes.  This included exercises focusing on stretching, strengthening, with significant focus on eccentric aspects.   Long term goals include an improvement in range of motion, strength, endurance as well as avoiding reinjury. Patient's frequency would include in 1-2 times a day, 3-5 times a week for a duration of 6-12 weeks.  Patellofemoral Syndrome  Reviewed anatomy using anatomical model and how PFS occurs.  Given rehab exercises handout for VMO, hip abductors, core, entire kinetic chain including proprioception exercises including cone touches, step downs, hip elevations and turn outs.  Could benefit from PT, regular exercise, upright biking, and a PFS knee brace to assist with tracking abnormalities. Proper technique shown and discussed handout in great detail with ATC.  All questions were discussed and answered.     Impression and Recommendations:     This case required medical decision making of moderate complexity. The above documentation has been reviewed and is accurate and complete Judi Saa, DO       Note: This dictation was prepared with Dragon dictation along with smaller phrase  technology. Any transcriptional errors that result from this process are unintentional.

## 2018-06-26 ENCOUNTER — Encounter: Payer: Self-pay | Admitting: Family Medicine

## 2018-06-26 ENCOUNTER — Ambulatory Visit (INDEPENDENT_AMBULATORY_CARE_PROVIDER_SITE_OTHER): Payer: BLUE CROSS/BLUE SHIELD | Admitting: Family Medicine

## 2018-06-26 ENCOUNTER — Ambulatory Visit: Payer: Self-pay

## 2018-06-26 VITALS — BP 102/62 | HR 73 | Ht 66.0 in | Wt 153.0 lb

## 2018-06-26 DIAGNOSIS — M25511 Pain in right shoulder: Secondary | ICD-10-CM | POA: Diagnosis not present

## 2018-06-26 DIAGNOSIS — M222X2 Patellofemoral disorders, left knee: Secondary | ICD-10-CM | POA: Insufficient documentation

## 2018-06-26 DIAGNOSIS — G8929 Other chronic pain: Secondary | ICD-10-CM

## 2018-06-26 DIAGNOSIS — M7501 Adhesive capsulitis of right shoulder: Secondary | ICD-10-CM

## 2018-06-26 NOTE — Assessment & Plan Note (Signed)
Patient does have the adhesive capsulitis.  Patient has done formal physical therapy, home exercises, MRI did not show anything significant except for a potential inferior glenohumeral ligament injury but I think it was more secondary to the artifact in the positioning of the fluoroscopy.  We discussed with patient that we can either repeat steroid injection which has been greater than 8 months.  We discussed topical anti-inflammatories and icing regimen.  Patient will follow-up with me again 4 to 8 weeks

## 2018-06-26 NOTE — Patient Instructions (Signed)
Good to see you  Ice is your friend Stay active I think you just push it and we give it more time Other possibilities include looking at it byu a surgeon or one mor einjection  See me again in 3 months For the knee Exercises 3 times a week.  pennsaid pinkie amount topically 2 times daily as needed.  Exercises 3 times a week.  If possible more biking then walking.

## 2018-06-26 NOTE — Assessment & Plan Note (Signed)
Patellofemoral syndrome, mild, likely some mild underlying arthritis.  Discussed icing regimen and home exercises.  Topical anti-inflammatories given, and focus on hip abductor strengthening vastus medialis oblique strengthening.  Follow-up again in 4 to 6 weeks

## 2018-06-27 DIAGNOSIS — F320 Major depressive disorder, single episode, mild: Secondary | ICD-10-CM | POA: Diagnosis not present

## 2018-07-25 DIAGNOSIS — F320 Major depressive disorder, single episode, mild: Secondary | ICD-10-CM | POA: Diagnosis not present

## 2018-08-22 DIAGNOSIS — F320 Major depressive disorder, single episode, mild: Secondary | ICD-10-CM | POA: Diagnosis not present

## 2018-09-10 DIAGNOSIS — E039 Hypothyroidism, unspecified: Secondary | ICD-10-CM | POA: Diagnosis not present

## 2018-09-10 DIAGNOSIS — N959 Unspecified menopausal and perimenopausal disorder: Secondary | ICD-10-CM | POA: Diagnosis not present

## 2018-09-19 DIAGNOSIS — F320 Major depressive disorder, single episode, mild: Secondary | ICD-10-CM | POA: Diagnosis not present

## 2018-09-25 ENCOUNTER — Ambulatory Visit: Payer: BLUE CROSS/BLUE SHIELD | Admitting: Family Medicine

## 2018-09-26 DIAGNOSIS — F988 Other specified behavioral and emotional disorders with onset usually occurring in childhood and adolescence: Secondary | ICD-10-CM | POA: Diagnosis not present

## 2018-09-26 DIAGNOSIS — R7989 Other specified abnormal findings of blood chemistry: Secondary | ICD-10-CM | POA: Diagnosis not present

## 2018-09-26 DIAGNOSIS — N959 Unspecified menopausal and perimenopausal disorder: Secondary | ICD-10-CM | POA: Diagnosis not present

## 2018-09-26 DIAGNOSIS — R140 Abdominal distension (gaseous): Secondary | ICD-10-CM | POA: Diagnosis not present

## 2018-10-17 DIAGNOSIS — F320 Major depressive disorder, single episode, mild: Secondary | ICD-10-CM | POA: Diagnosis not present

## 2018-11-05 IMAGING — XA DG FLUORO GUIDE NDL PLC/BX
2 series · 2 of 2 positions shown · non-contrast
Comparison: none

CLINICAL DATA: RIGHT shoulder pain.

[Series 1: ortho standard · 1 of 1 slices shown (1 of 2)]
[im 1/1]
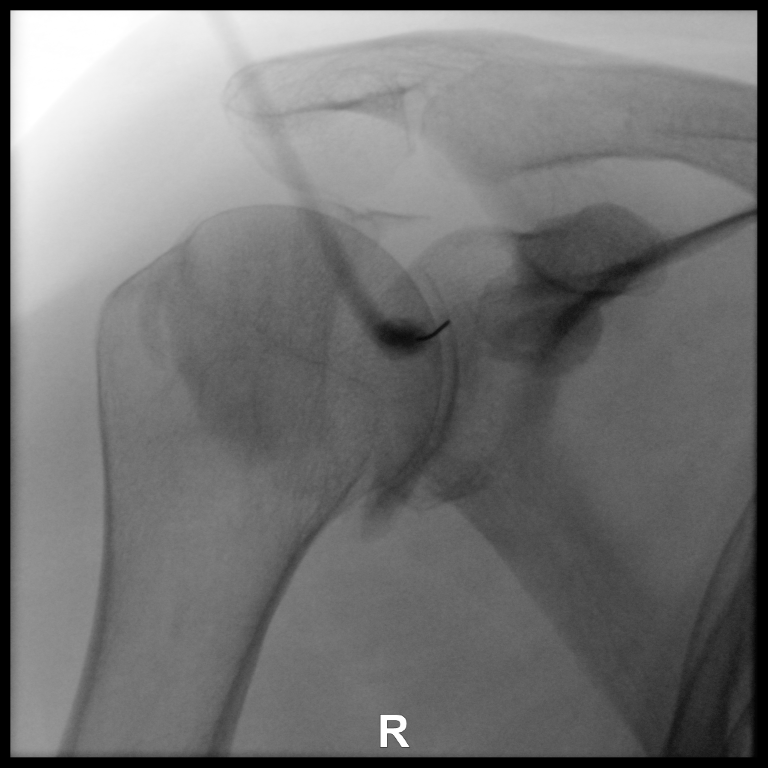

[Series 2: ortho standard · 1 of 1 slices shown (2 of 2)]
[im 1/1]
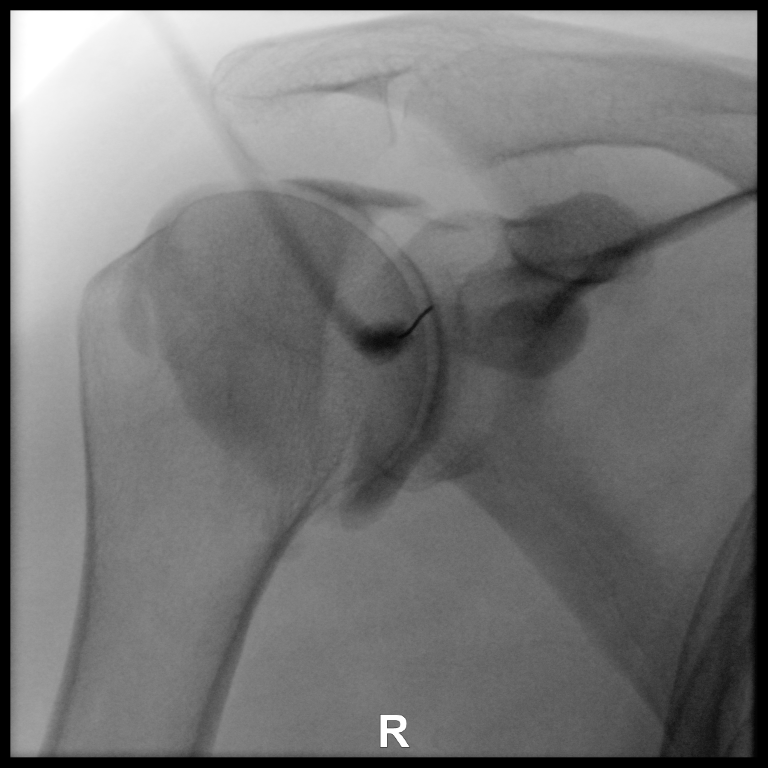

[2 of 2 positions shown; findings below may reference images not displayed]

FLUOROSCOPY TIME:  7 seconds corresponding to a Dose Area Product of
7.4 Gy*m2

PROCEDURE:
RIGHT SHOULDER INJECTION UNDER FLUOROSCOPY

Informed written consent was obtained.  Time-out was performed.

An appropriate skin entrance site was determined. The site was
marked, prepped with Betadine, draped in the usual sterile fashion,
and infiltrated locally with 1% lidocaine. 22 gauge spinal needle
was advanced to the superomedial margin of the humeral head under
intermittent fluoroscopy. 1 ml of Lidocaine injected easily. A
mixture of 0.1 ml Multihance and 20 ml of dilute Isovue M 200 was
then used to opacify the RIGHT shoulder capsule. No immediate
complication.
IMPRESSION: Technically successful RIGHT shoulder injection for MRI.

## 2018-11-19 DIAGNOSIS — F320 Major depressive disorder, single episode, mild: Secondary | ICD-10-CM | POA: Diagnosis not present

## 2018-11-28 DIAGNOSIS — Z08 Encounter for follow-up examination after completed treatment for malignant neoplasm: Secondary | ICD-10-CM | POA: Diagnosis not present

## 2018-11-28 DIAGNOSIS — L821 Other seborrheic keratosis: Secondary | ICD-10-CM | POA: Diagnosis not present

## 2018-11-28 DIAGNOSIS — D1801 Hemangioma of skin and subcutaneous tissue: Secondary | ICD-10-CM | POA: Diagnosis not present

## 2018-11-28 DIAGNOSIS — L570 Actinic keratosis: Secondary | ICD-10-CM | POA: Diagnosis not present

## 2018-11-28 DIAGNOSIS — Z85820 Personal history of malignant melanoma of skin: Secondary | ICD-10-CM | POA: Diagnosis not present

## 2018-12-07 DIAGNOSIS — R232 Flushing: Secondary | ICD-10-CM | POA: Diagnosis not present

## 2018-12-07 DIAGNOSIS — E039 Hypothyroidism, unspecified: Secondary | ICD-10-CM | POA: Diagnosis not present

## 2018-12-07 DIAGNOSIS — F329 Major depressive disorder, single episode, unspecified: Secondary | ICD-10-CM | POA: Diagnosis not present

## 2018-12-07 DIAGNOSIS — B009 Herpesviral infection, unspecified: Secondary | ICD-10-CM | POA: Diagnosis not present

## 2018-12-17 DIAGNOSIS — F320 Major depressive disorder, single episode, mild: Secondary | ICD-10-CM | POA: Diagnosis not present

## 2019-01-02 DIAGNOSIS — F320 Major depressive disorder, single episode, mild: Secondary | ICD-10-CM | POA: Diagnosis not present

## 2019-02-27 DIAGNOSIS — N951 Menopausal and female climacteric states: Secondary | ICD-10-CM | POA: Diagnosis not present

## 2019-02-27 DIAGNOSIS — R232 Flushing: Secondary | ICD-10-CM | POA: Diagnosis not present

## 2019-02-27 DIAGNOSIS — F329 Major depressive disorder, single episode, unspecified: Secondary | ICD-10-CM | POA: Diagnosis not present

## 2019-03-14 DIAGNOSIS — R300 Dysuria: Secondary | ICD-10-CM | POA: Diagnosis not present

## 2019-04-30 DIAGNOSIS — H524 Presbyopia: Secondary | ICD-10-CM | POA: Diagnosis not present

## 2019-04-30 DIAGNOSIS — H52223 Regular astigmatism, bilateral: Secondary | ICD-10-CM | POA: Diagnosis not present

## 2019-04-30 DIAGNOSIS — D3122 Benign neoplasm of left retina: Secondary | ICD-10-CM | POA: Diagnosis not present

## 2019-04-30 DIAGNOSIS — H5213 Myopia, bilateral: Secondary | ICD-10-CM | POA: Diagnosis not present

## 2019-06-07 DIAGNOSIS — M7918 Myalgia, other site: Secondary | ICD-10-CM | POA: Diagnosis not present

## 2019-06-07 DIAGNOSIS — E2839 Other primary ovarian failure: Secondary | ICD-10-CM | POA: Diagnosis not present

## 2019-06-07 DIAGNOSIS — M25551 Pain in right hip: Secondary | ICD-10-CM | POA: Diagnosis not present

## 2019-06-12 DIAGNOSIS — Z20822 Contact with and (suspected) exposure to covid-19: Secondary | ICD-10-CM | POA: Diagnosis not present

## 2019-08-07 DIAGNOSIS — M85851 Other specified disorders of bone density and structure, right thigh: Secondary | ICD-10-CM | POA: Diagnosis not present

## 2019-08-07 DIAGNOSIS — Z1231 Encounter for screening mammogram for malignant neoplasm of breast: Secondary | ICD-10-CM | POA: Diagnosis not present

## 2019-08-07 DIAGNOSIS — E2839 Other primary ovarian failure: Secondary | ICD-10-CM | POA: Diagnosis not present

## 2019-08-14 DIAGNOSIS — N951 Menopausal and female climacteric states: Secondary | ICD-10-CM | POA: Diagnosis not present

## 2019-08-14 DIAGNOSIS — F329 Major depressive disorder, single episode, unspecified: Secondary | ICD-10-CM | POA: Diagnosis not present

## 2019-08-14 DIAGNOSIS — R232 Flushing: Secondary | ICD-10-CM | POA: Diagnosis not present

## 2019-10-02 DIAGNOSIS — R7989 Other specified abnormal findings of blood chemistry: Secondary | ICD-10-CM | POA: Diagnosis not present

## 2019-10-02 DIAGNOSIS — E538 Deficiency of other specified B group vitamins: Secondary | ICD-10-CM | POA: Diagnosis not present

## 2019-10-02 DIAGNOSIS — F988 Other specified behavioral and emotional disorders with onset usually occurring in childhood and adolescence: Secondary | ICD-10-CM | POA: Diagnosis not present

## 2019-10-02 DIAGNOSIS — Z1589 Genetic susceptibility to other disease: Secondary | ICD-10-CM | POA: Diagnosis not present

## 2019-10-02 DIAGNOSIS — E559 Vitamin D deficiency, unspecified: Secondary | ICD-10-CM | POA: Diagnosis not present

## 2019-10-02 DIAGNOSIS — E039 Hypothyroidism, unspecified: Secondary | ICD-10-CM | POA: Diagnosis not present

## 2019-10-02 DIAGNOSIS — R140 Abdominal distension (gaseous): Secondary | ICD-10-CM | POA: Diagnosis not present

## 2019-10-02 DIAGNOSIS — N959 Unspecified menopausal and perimenopausal disorder: Secondary | ICD-10-CM | POA: Diagnosis not present

## 2019-10-16 DIAGNOSIS — N959 Unspecified menopausal and perimenopausal disorder: Secondary | ICD-10-CM | POA: Diagnosis not present

## 2019-10-16 DIAGNOSIS — R7989 Other specified abnormal findings of blood chemistry: Secondary | ICD-10-CM | POA: Diagnosis not present

## 2019-10-16 DIAGNOSIS — F988 Other specified behavioral and emotional disorders with onset usually occurring in childhood and adolescence: Secondary | ICD-10-CM | POA: Diagnosis not present

## 2019-10-16 DIAGNOSIS — Z1589 Genetic susceptibility to other disease: Secondary | ICD-10-CM | POA: Diagnosis not present

## 2019-11-25 DIAGNOSIS — L820 Inflamed seborrheic keratosis: Secondary | ICD-10-CM | POA: Diagnosis not present

## 2019-11-25 DIAGNOSIS — Z85820 Personal history of malignant melanoma of skin: Secondary | ICD-10-CM | POA: Diagnosis not present

## 2019-11-25 DIAGNOSIS — L821 Other seborrheic keratosis: Secondary | ICD-10-CM | POA: Diagnosis not present

## 2019-11-25 DIAGNOSIS — D1801 Hemangioma of skin and subcutaneous tissue: Secondary | ICD-10-CM | POA: Diagnosis not present

## 2019-12-12 DIAGNOSIS — N959 Unspecified menopausal and perimenopausal disorder: Secondary | ICD-10-CM | POA: Diagnosis not present

## 2019-12-12 DIAGNOSIS — Z1589 Genetic susceptibility to other disease: Secondary | ICD-10-CM | POA: Diagnosis not present

## 2019-12-12 DIAGNOSIS — F988 Other specified behavioral and emotional disorders with onset usually occurring in childhood and adolescence: Secondary | ICD-10-CM | POA: Diagnosis not present

## 2019-12-12 DIAGNOSIS — E039 Hypothyroidism, unspecified: Secondary | ICD-10-CM | POA: Diagnosis not present

## 2019-12-12 DIAGNOSIS — R7989 Other specified abnormal findings of blood chemistry: Secondary | ICD-10-CM | POA: Diagnosis not present

## 2019-12-23 DIAGNOSIS — E039 Hypothyroidism, unspecified: Secondary | ICD-10-CM | POA: Diagnosis not present

## 2019-12-23 DIAGNOSIS — R7989 Other specified abnormal findings of blood chemistry: Secondary | ICD-10-CM | POA: Diagnosis not present

## 2019-12-23 DIAGNOSIS — E538 Deficiency of other specified B group vitamins: Secondary | ICD-10-CM | POA: Diagnosis not present

## 2019-12-23 DIAGNOSIS — N959 Unspecified menopausal and perimenopausal disorder: Secondary | ICD-10-CM | POA: Diagnosis not present

## 2019-12-26 ENCOUNTER — Encounter: Payer: Self-pay | Admitting: Family Medicine

## 2019-12-26 ENCOUNTER — Other Ambulatory Visit: Payer: Self-pay

## 2019-12-26 ENCOUNTER — Ambulatory Visit (INDEPENDENT_AMBULATORY_CARE_PROVIDER_SITE_OTHER): Payer: BC Managed Care – PPO

## 2019-12-26 ENCOUNTER — Ambulatory Visit: Payer: Self-pay

## 2019-12-26 ENCOUNTER — Ambulatory Visit (INDEPENDENT_AMBULATORY_CARE_PROVIDER_SITE_OTHER): Payer: BC Managed Care – PPO | Admitting: Family Medicine

## 2019-12-26 VITALS — BP 120/82 | HR 94 | Ht 66.0 in | Wt 153.0 lb

## 2019-12-26 DIAGNOSIS — M542 Cervicalgia: Secondary | ICD-10-CM | POA: Diagnosis not present

## 2019-12-26 DIAGNOSIS — M25521 Pain in right elbow: Secondary | ICD-10-CM | POA: Diagnosis not present

## 2019-12-26 DIAGNOSIS — M7711 Lateral epicondylitis, right elbow: Secondary | ICD-10-CM | POA: Diagnosis not present

## 2019-12-26 DIAGNOSIS — M50322 Other cervical disc degeneration at C5-C6 level: Secondary | ICD-10-CM | POA: Diagnosis not present

## 2019-12-26 NOTE — Patient Instructions (Addendum)
Good to see you Elbow and neck xray today K2 100-200 mcg daily Monitor at eye level Exercise 3 times a week See me again in 7-8 weeks

## 2019-12-26 NOTE — Assessment & Plan Note (Signed)
X-rays pending, home exercises for scapular dyskinesis, discussed posture and ergonomics follow-up again 6 to 8 weeks

## 2019-12-26 NOTE — Progress Notes (Signed)
[name] [address] [place] [address] Phone: [phone] Subjective:   I Ronelle Nigh am serving as a Neurosurgeon for Dr. Antoine Primas.  This visit occurred during the SARS-CoV-2 public health emergency.  Safety protocols were in place, including screening questions prior to the visit, additional usage of staff PPE, and extensive cleaning of exam room while observing appropriate contact time as indicated for disinfecting solutions.   I'm seeing this patient by the request  of:  Scifres, Nicole Cella, PA-C  CC: Neck pain, elbow pain  OJJ:[id number]  Sandra Stanton is a 59 y.o. female coming in with complaint of right elbow pain. Last seen in 06/2018 for knee and shoulder pain. Patient states Patient states she had an accident when she slipped on ice slush. When she fell she hit her elbow. Pain is chronic. ADLs are difficult. Pain with flexion or extension. TTP. Pain with lifting an holding things. Has tried, ice, heat, oral and topical medications. 5-6/10 at its worse.       No past medical history on file. Past Surgical History:  Procedure Laterality Date  . BREAST BIOPSY  2008  . CESAREAN SECTION  [id number]  . GALLBLADDER SURGERY  2007   Social History   Socioeconomic History  . Marital status: Married    Spouse name: Not on file  . Number of children: Not on file  . Years of education: Not on file  . Highest education level: Not on file  Occupational History  . Not on file  Tobacco Use  . Smoking status: Former Games developer  . Smokeless tobacco: Never Used  Substance and Sexual Activity  . Alcohol use: Not on file  . Drug use: Not on file  . Sexual activity: Not on file  Other Topics Concern  . Not on file  Social History Narrative  . Not on file   Social Determinants of Health   Financial Resource Strain:   . Difficulty of Paying Living Expenses: Not on file  Food Insecurity:   . Worried About Programme researcher, broadcasting/film/video in the Last  Year: Not on file  . Ran Out of Food in the Last Year: Not on file  Transportation Needs:   . Lack of Transportation (Medical): Not on file  . Lack of Transportation (Non-Medical): Not on file  Physical Activity:   . Days of Exercise per Week: Not on file  . Minutes of Exercise per Session: Not on file  Stress:   . Feeling of Stress : Not on file  Social Connections:   . Frequency of Communication with Friends and Family: Not on file  . Frequency of Social Gatherings with Friends and Family: Not on file  . Attends Religious Services: Not on file  . Active Member of Clubs or Organizations: Not on file  . Attends Banker Meetings: Not on file  . Marital Status: Not on file   Not on File No family history on file.       Current Outpatient Medications (Other):  .  gabapentin (NEURONTIN) 100 MG capsule, TAKE 2 CAPSULES(200 MG) BY MOUTH AT BEDTIME .  Vitamin D, Ergocalciferol, (DRISDOL) 50000 units CAPS capsule, Take 1 capsule (50,000 Units total) by mouth every 7 (seven) days.   Reviewed prior external information including notes and imaging from  primary care provider As well as notes that were available from care everywhere and other healthcare systems.  Past medical history, social, surgical and family history all reviewed in  electronic medical record.  No pertanent information unless stated regarding to the chief complaint.   Review of Systems:  No headache, visual changes, nausea, vomiting, diarrhea, constipation, dizziness, abdominal pain, skin rash, fevers, chills, night sweats, weight loss, swollen lymph nodes, body aches, joint swelling, chest pain, shortness of breath, mood changes. POSITIVE muscle aches  Objective  Blood pressure 120/82, pulse 94, height 5\' 6"  (1.676 m), weight 153 lb (69.4 kg), SpO2 98 %.   General: No apparent distress alert and oriented x3 mood and affect normal, dressed appropriately.  HEENT: Pupils equal, extraocular movements  intact  Respiratory: Patient's speak in full sentences and does not appear short of breath  Cardiovascular: No lower extremity edema, non tender, no erythema  Neuro: Cranial nerves II through XII are intact, neurovascularly intact in all extremities with 2+ DTRs and 2+ pulses.  Gait normal with good balance and coordination.  MSK: Right elbow exam shows that there may be some mild fullness around the lateral epicondylitis.  Tender to palpation in this area and minorly over the olecranon.  Patient has pain with resisted wrist extension.  Full range of motion of the elbow otherwise.  Good grip strength  Neck exam mild loss of lordosis, tightness of the left side of the neck.  Seems to be more in the parascapular region as well.  Negative Spurling's.  5 out of 5 strength of the upper extremities.  Limited musculoskeletal ultrasound was performed and interpreted by  Limited ultrasound of patient's elbow shows patient does have some mild hypoechoic changes at the insertion of the common extensor tendon.  Patient also has very mild cortical irregularity on the lateral aspect of the elbow but does not appear to be anything specific.  No significant joint effusion noted. Impression: Lateral epicondylitis     Impression and Recommendations:     The above documentation has been reviewed and is accurate and complete Judi Saa, DO       Note: This dictation was prepared with Dragon dictation along with smaller phrase technology. Any transcriptional errors that result from this process are unintentional.

## 2019-12-26 NOTE — Assessment & Plan Note (Signed)
Appears to be more of a lateral epicondylitis but patient did have some mild calcific changes noted on the posterior aspect of the elbow that could account with a small traumatic aspect.  Likely not contributing.  Patient has multiple pain that she is having now.  Discussed with patient about doing a wrist brace, tamsulosin, x-rays ordered today to further lobulated nodule.  Topical anti-inflammatories and icing regimen.  Follow-up again in 4 to 8 weeks

## 2020-02-13 ENCOUNTER — Ambulatory Visit: Payer: BC Managed Care – PPO | Admitting: Family Medicine

## 2020-02-19 ENCOUNTER — Encounter: Payer: Self-pay | Admitting: Family Medicine

## 2020-02-19 ENCOUNTER — Ambulatory Visit: Payer: Self-pay

## 2020-02-19 ENCOUNTER — Other Ambulatory Visit: Payer: Self-pay

## 2020-02-19 ENCOUNTER — Ambulatory Visit (INDEPENDENT_AMBULATORY_CARE_PROVIDER_SITE_OTHER): Payer: BC Managed Care – PPO | Admitting: Family Medicine

## 2020-02-19 VITALS — BP 128/88 | HR 85 | Ht 66.0 in | Wt 151.0 lb

## 2020-02-19 DIAGNOSIS — M25521 Pain in right elbow: Secondary | ICD-10-CM | POA: Diagnosis not present

## 2020-02-19 DIAGNOSIS — M7501 Adhesive capsulitis of right shoulder: Secondary | ICD-10-CM

## 2020-02-19 DIAGNOSIS — M7711 Lateral epicondylitis, right elbow: Secondary | ICD-10-CM | POA: Diagnosis not present

## 2020-02-19 NOTE — Assessment & Plan Note (Signed)
Patient has had this for quite some time at the moment.  Not making as much progress.  On ultrasound today does have increasing in neovascularization in the area as well as potentially seeing a tear.  We discussed different treatment options and patient has elected to try the PRP instead of the steroid and a formal physical therapy at this time.  Patient will be set up in the near future to try this injection and then patient understands that there will be 6 weeks of slowly increasing in progression.  Likely will put patient on 2 weeks of light duty to avoid excessive use.

## 2020-02-19 NOTE — Progress Notes (Signed)
[name] [address] [place] [address] Phone: [phone] Subjective:   [name], am serving as a scribe for Dr. Antoine Primas. This visit occurred during the SARS-CoV-2 public health emergency.  Safety protocols were in place, including screening questions prior to the visit, additional usage of staff PPE, and extensive cleaning of exam room while observing appropriate contact time as indicated for disinfecting solutions.   I'm seeing this patient by the request  of:  Scifres, Peter Minium  CC: Right elbow pain  [id number]   12/26/2019 Appears to be more of a lateral epicondylitis but patient did have some mild calcific changes noted on the posterior aspect of the elbow that could account with a small traumatic aspect.  Likely not contributing.  Patient has multiple pain that she is having now.  Discussed with patient about doing a wrist brace, tamsulosin, x-rays ordered today to further lobulated nodule.  Topical anti-inflammatories and icing regimen.  Follow-up again in 4 to 8 weeks   Update 02/19/2020 Sandra Stanton is a 59 y.o. female coming in with complaint of right elbow pain. Patient states that she had been doing HEP. Pain continues with bicep curls and pronation and supination. Pain over lateral and posterior aspect of elbow. Patient did wear wrist brace as prescribed.  If anything she thinks she is slowly worsening      No past medical history on file. Past Surgical History:  Procedure Laterality Date  . BREAST BIOPSY  2008  . CESAREAN SECTION  [id number]  . GALLBLADDER SURGERY  2007   Social History   Socioeconomic History  . Marital status: Married    Spouse name: Not on file  . Number of children: Not on file  . Years of education: Not on file  . Highest education level: Not on file  Occupational History  . Not on file  Tobacco Use  . Smoking status: Former Games developer  . Smokeless tobacco: Never Used   Substance and Sexual Activity  . Alcohol use: Not on file  . Drug use: Not on file  . Sexual activity: Not on file  Other Topics Concern  . Not on file  Social History Narrative  . Not on file   Social Determinants of Health   Financial Resource Strain:   . Difficulty of Paying Living Expenses: Not on file  Food Insecurity:   . Worried About Programme researcher, broadcasting/film/video in the Last Year: Not on file  . Ran Out of Food in the Last Year: Not on file  Transportation Needs:   . Lack of Transportation (Medical): Not on file  . Lack of Transportation (Non-Medical): Not on file  Physical Activity:   . Days of Exercise per Week: Not on file  . Minutes of Exercise per Session: Not on file  Stress:   . Feeling of Stress : Not on file  Social Connections:   . Frequency of Communication with Friends and Family: Not on file  . Frequency of Social Gatherings with Friends and Family: Not on file  . Attends Religious Services: Not on file  . Active Member of Clubs or Organizations: Not on file  . Attends Banker Meetings: Not on file  . Marital Status: Not on file   Not on File No family history on file.       Current Outpatient Medications (Other):  .  gabapentin (NEURONTIN) 100 MG capsule, TAKE 2 CAPSULES(200 MG) BY MOUTH AT BEDTIME .  Vitamin D, Ergocalciferol, (DRISDOL) 50000 units CAPS capsule, Take 1 capsule (50,000 Units total) by mouth every 7 (seven) days.   Reviewed prior external information including notes and imaging from  primary care provider As well as notes that were available from care everywhere and other healthcare systems.  Past medical history, social, surgical and family history all reviewed in electronic medical record.  No pertanent information unless stated regarding to the chief complaint.   Review of Systems:  No headache, visual changes, nausea, vomiting, diarrhea, constipation, dizziness, abdominal pain, skin rash, fevers, chills, night sweats,  weight loss, swollen lymph nodes, body aches, joint swelling, chest pain, shortness of breath, mood changes. POSITIVE muscle aches  Objective  Blood pressure 128/88, pulse 85, height 5\' 6"  (1.676 m), weight 151 lb (68.5 kg), SpO2 99 %.   General: No apparent distress alert and oriented x3 mood and affect normal, dressed appropriately.  HEENT: Pupils equal, extraocular movements intact  Respiratory: Patient's speak in full sentences and does not appear short of breath  Cardiovascular: No lower extremity edema, non tender, no erythema  Neuro: Cranial nerves II through XII are intact, neurovascularly intact in all extremities with 2+ DTRs and 2+ pulses.  Gait normal with good balance and coordination.  MSK: Right elbow shows the patient is having more discomfort over the lateral epicondylar region.  No true swelling noted.  Patient does have pain with resisted extension of the wrist.  Good grip strength.  Full range of motion of the elbow otherwise  Limited musculoskeletal ultrasound was performed and interpreted by  Limited ultrasound of patient's elbow shows that patient's lateral epicondylar region the common extensor tendon does seem to have a small tear in it and does have increasing neovascularization.  No decrease in the hypoechoic changes seen previously. Impression: Common extensor tendon tear    Impression and Recommendations:     The above documentation has been reviewed and is accurate and complete Judi Saa, DO

## 2020-02-19 NOTE — Patient Instructions (Signed)
PRP right elbow schedule up front No using pizza cutter for 2 weeks after PRP See ya soon

## 2020-02-19 NOTE — Assessment & Plan Note (Signed)
Patient's MRI seem to not show anything significant that would be surgical at this time.  Possible inflammation.  Glenohumeral ligament that could still give patient some discomfort.  Patient brought this up when she was leaving.  We discussed there is other things we could potentially do such as formal physical therapy or injections but patient states not that bad.

## 2020-02-20 DIAGNOSIS — R232 Flushing: Secondary | ICD-10-CM | POA: Diagnosis not present

## 2020-02-20 DIAGNOSIS — E039 Hypothyroidism, unspecified: Secondary | ICD-10-CM | POA: Diagnosis not present

## 2020-02-20 DIAGNOSIS — F329 Major depressive disorder, single episode, unspecified: Secondary | ICD-10-CM | POA: Diagnosis not present

## 2020-02-20 DIAGNOSIS — N951 Menopausal and female climacteric states: Secondary | ICD-10-CM | POA: Diagnosis not present

## 2020-02-26 ENCOUNTER — Ambulatory Visit: Payer: Self-pay | Admitting: Family Medicine

## 2020-02-26 ENCOUNTER — Ambulatory Visit: Payer: Self-pay

## 2020-02-26 ENCOUNTER — Encounter: Payer: Self-pay | Admitting: Family Medicine

## 2020-02-26 ENCOUNTER — Other Ambulatory Visit: Payer: Self-pay

## 2020-02-26 VITALS — BP 118/80 | HR 74 | Ht 66.0 in | Wt 139.0 lb

## 2020-02-26 DIAGNOSIS — M25521 Pain in right elbow: Secondary | ICD-10-CM

## 2020-02-26 DIAGNOSIS — M7711 Lateral epicondylitis, right elbow: Secondary | ICD-10-CM

## 2020-02-26 NOTE — Patient Instructions (Signed)
PRP injection today No ice or IBU for 3 days See me in 3 weeks

## 2020-02-26 NOTE — Progress Notes (Signed)
[name] [address] [place] [address] Phone: [phone] Subjective:   [name], am serving as a scribe for Dr. Antoine Primas. This visit occurred during the SARS-CoV-2 public health emergency.  Safety protocols were in place, including screening questions prior to the visit, additional usage of staff PPE, and extensive cleaning of exam room while observing appropriate contact time as indicated for disinfecting solutions.  I'm seeing this patient by the request  of:  Scifres, Peter Minium  CC: Right elbow pain  [id number]   02/19/2020 Patient has had this for quite some time at the moment.  Not making as much progress.  On ultrasound today does have increasing in neovascularization in the area as well as potentially seeing a tear.  We discussed different treatment options and patient has elected to try the PRP instead of the steroid and a formal physical therapy at this time.  Patient will be set up in the near future to try this injection and then patient understands that there will be 6 weeks of slowly increasing in progression.  Likely will put patient on 2 weeks of light duty to avoid excessive use.   Update 02/26/2020 MALLI FALOTICO is a 59 y.o. female coming in with complaint of right elbow pain. Found to have lateral epicondylitis, failed conservative therapy and is here for PRP      No past medical history on file. Past Surgical History:  Procedure Laterality Date  . BREAST BIOPSY  2008  . CESAREAN SECTION  [id number]  . GALLBLADDER SURGERY  2007   Social History   Socioeconomic History  . Marital status: Married    Spouse name: Not on file  . Number of children: Not on file  . Years of education: Not on file  . Highest education level: Not on file  Occupational History  . Not on file  Tobacco Use  . Smoking status: Former Games developer  . Smokeless tobacco: Never Used  Substance and Sexual Activity  . Alcohol  use: Not on file  . Drug use: Not on file  . Sexual activity: Not on file  Other Topics Concern  . Not on file  Social History Narrative  . Not on file   Social Determinants of Health   Financial Resource Strain:   . Difficulty of Paying Living Expenses: Not on file  Food Insecurity:   . Worried About Programme researcher, broadcasting/film/video in the Last Year: Not on file  . Ran Out of Food in the Last Year: Not on file  Transportation Needs:   . Lack of Transportation (Medical): Not on file  . Lack of Transportation (Non-Medical): Not on file  Physical Activity:   . Days of Exercise per Week: Not on file  . Minutes of Exercise per Session: Not on file  Stress:   . Feeling of Stress : Not on file  Social Connections:   . Frequency of Communication with Friends and Family: Not on file  . Frequency of Social Gatherings with Friends and Family: Not on file  . Attends Religious Services: Not on file  . Active Member of Clubs or Organizations: Not on file  . Attends Banker Meetings: Not on file  . Marital Status: Not on file   Not on File No family history on file.       Current Outpatient Medications (Other):  .  gabapentin (NEURONTIN) 100 MG capsule, TAKE 2 CAPSULES(200 MG) BY MOUTH AT BEDTIME .  Vitamin D, Ergocalciferol, (DRISDOL) 50000 units CAPS capsule, Take 1 capsule (50,000 Units total) by mouth every 7 (seven) days.   Reviewed prior external information including notes and imaging from  primary care provider As well as notes that were available from care everywhere and other healthcare systems.  Past medical history, social, surgical and family history all reviewed in electronic medical record.  No pertanent information unless stated regarding to the chief complaint.   Review of Systems:  No headache, visual changes, nausea, vomiting, diarrhea, constipation, dizziness, abdominal pain, skin rash, fevers, chills, night sweats, weight loss, swollen lymph nodes, body  aches, joint swelling, chest pain, shortness of breath, mood changes. POSITIVE muscle aches  Objective  Blood pressure 118/80, pulse 74, height 5\' 6"  (1.676 m), weight 139 lb (63 kg), SpO2 98 %.   General: No apparent distress alert and oriented x3 mood and affect normal, dressed appropriately.  HEENT: Pupils equal, extraocular movements intact  Respiratory: Patient's speak in full sentences and does not appear short of breath  Cardiovascular: No lower extremity edema, non tender, no erythema  Gait normal with good balance and coordination.  MSK: Right elbow still has severe tenderness to palpation over the lateral epicondylar region.  Worsening pain with extension of the wrist.  Procedure: Real-time Ultrasound Guided Injection of right common extensor tendon sheath Device: GE Logiq Q7 Ultrasound guided injection is preferred based studies that show increased duration, increased effect, greater accuracy, decreased procedural pain, increased response rate, and decreased cost with ultrasound guided versus blind injection.  Verbal informed consent obtained.  Time-out conducted.  Noted no overlying erythema, induration, or other signs of local infection.  Skin prepped in a sterile fashion.  Local anesthesia: Topical Ethyl chloride.  With sterile technique and under real time ultrasound guidance: With a 21-gauge 2 inch needle injected into the right lateral epicondylar region with a total of 0.5 cc of 0.5% Marcaine.  We then injected 2-1/2 cc of precentrifuge PRP into the extensor tendon sheath Completed without difficulty  Patient did have a mild vasovagal but did not pass out Advised to call if fevers/chills, erythema, induration, drainage, or persistent bleeding.  Impression: Technically successful ultrasound guided injection.    Impression and Recommendations:     The above documentation has been reviewed and is accurate and complete , DO

## 2020-02-26 NOTE — Assessment & Plan Note (Signed)
Patient given injection today.  Patient tolerated the procedure recently but did have a near vasovagal 10.  Patient is going to try to increase activity very slowly over the course the next several weeks.  I would like to see patient again within the next 3 to 6 weeks to make sure patient is improving and we will order a ultrasound to see if any of that increase in neovascularization of the Doppler has improved.

## 2020-03-05 ENCOUNTER — Encounter: Payer: Self-pay | Admitting: Family Medicine

## 2020-03-13 DIAGNOSIS — Z1159 Encounter for screening for other viral diseases: Secondary | ICD-10-CM | POA: Diagnosis not present

## 2020-03-18 DIAGNOSIS — Z1211 Encounter for screening for malignant neoplasm of colon: Secondary | ICD-10-CM | POA: Diagnosis not present

## 2020-03-19 ENCOUNTER — Ambulatory Visit: Payer: BC Managed Care – PPO | Admitting: Family Medicine

## 2020-03-23 ENCOUNTER — Ambulatory Visit (INDEPENDENT_AMBULATORY_CARE_PROVIDER_SITE_OTHER): Payer: BC Managed Care – PPO | Admitting: Family Medicine

## 2020-03-23 ENCOUNTER — Other Ambulatory Visit: Payer: Self-pay

## 2020-03-23 ENCOUNTER — Ambulatory Visit: Payer: Self-pay

## 2020-03-23 ENCOUNTER — Encounter: Payer: Self-pay | Admitting: Family Medicine

## 2020-03-23 VITALS — BP 110/72 | HR 97 | Ht 66.0 in | Wt 153.0 lb

## 2020-03-23 DIAGNOSIS — M7711 Lateral epicondylitis, right elbow: Secondary | ICD-10-CM

## 2020-03-23 DIAGNOSIS — M25521 Pain in right elbow: Secondary | ICD-10-CM

## 2020-03-23 NOTE — Assessment & Plan Note (Signed)
Therapy was done and it does show that there is some mild healing noted.  Patient is to continue the medications including vitamin D.  Patient did not do the home exercises as regularly as she was supposed to.  Patient is going to start increasing activity slowly.  Patient will follow up with me again

## 2020-03-23 NOTE — Patient Instructions (Signed)
Start exercises and do as stated Avoid overhand lifting as much as possible See me in 5-6 weeks

## 2020-03-23 NOTE — Progress Notes (Signed)
[facility] [address] [place] [address] Phone: [phone] Subjective:   Sandra Stanton, am serving as a scribe for Dr. Antoine [facility]. This visit occurred during the SARS-CoV-2 public health emergency.  Safety protocols were in place, including screening questions prior to the visit, additional usage of staff PPE, and extensive cleaning of exam room while observing appropriate contact time as indicated for disinfecting solutions.   I'm seeing this patient by the request  of:  Sandra Stanton  CC: Left elbow follow-up patient had PRP done on February 26, 2020  [id number]   02/26/2020 Patient given injection today.  Patient tolerated the procedure recently but did have a near vasovagal 10.  Patient is going to try to increase activity very slowly over the course the next several weeks.  I would like to see patient again within the next 3 to 6 weeks to make sure patient is improving and we will order a ultrasound to see if any of that increase in neovascularization of the Doppler has improved.  Update 03/23/2020 Sandra Stanton is a 59 y.o. female coming in with complaint of right elbow pain. Patient went on her trip and used a scissors to cut items 1 week after injection. Has backed off using her hand a lot since returned. States that when she doesn't use her hand she has no pain. Unable to grab phone or sleep on that side without pain.        No past medical history on file. Past Surgical History:  Procedure Laterality Date  . BREAST BIOPSY  2008  . CESAREAN SECTION  [id number]  . GALLBLADDER SURGERY  2007   Social History   Socioeconomic History  . Marital status: Married    Spouse name: Not on file  . Number of children: Not on file  . Years of education: Not on file  . Highest education level: Not on file  Occupational History  . Not on file  Tobacco Use  . Smoking status: Former Games developer  . Smokeless tobacco: Never  Used  Substance and Sexual Activity  . Alcohol use: Not on file  . Drug use: Not on file  . Sexual activity: Not on file  Other Topics Concern  . Not on file  Social History Narrative  . Not on file   Social Determinants of Health   Financial Resource Strain:   . Difficulty of Paying Living Expenses: Not on file  Food Insecurity:   . Worried About Programme researcher, broadcasting/film/video in the Last Year: Not on file  . Ran Out of Food in the Last Year: Not on file  Transportation Needs:   . Lack of Transportation (Medical): Not on file  . Lack of Transportation (Non-Medical): Not on file  Physical Activity:   . Days of Exercise per Week: Not on file  . Minutes of Exercise per Session: Not on file  Stress:   . Feeling of Stress : Not on file  Social Connections:   . Frequency of Communication with Friends and Family: Not on file  . Frequency of Social Gatherings with Friends and Family: Not on file  . Attends Religious Services: Not on file  . Active Member of Clubs or Organizations: Not on file  . Attends Banker Meetings: Not on file  . Marital Status: Not on file   Not on File No family history on file.       Current Outpatient Medications (Other):  .  gabapentin (NEURONTIN) 100 MG capsule, TAKE 2 CAPSULES(200 MG) BY MOUTH AT BEDTIME .  Vitamin D, Ergocalciferol, (DRISDOL) 50000 units CAPS capsule, Take 1 capsule (50,000 Units total) by mouth every 7 (seven) days.   Reviewed prior external information including notes and imaging from  primary care provider As well as notes that were available from care everywhere and other healthcare systems.  Past medical history, social, surgical and family history all reviewed in electronic medical record.  No pertanent information unless stated regarding to the chief complaint.   Review of Systems:  No headache, visual changes, nausea, vomiting, diarrhea, constipation, dizziness, abdominal pain, skin rash, fevers, chills, night  sweats, weight loss, swollen lymph nodes, body aches, joint swelling, chest pain, shortness of breath, mood changes. POSITIVE muscle aches  Objective  Blood pressure 110/72, pulse 97, height 5\' 6"  (1.676 m), weight 153 lb (69.4 kg), SpO2 99 %.   General: No apparent distress alert and oriented x3 mood and affect normal, dressed appropriately.  HEENT: Pupils equal, extraocular movements intact  Respiratory: Patient's speak in full sentences and does not appear short of breath  Cardiovascular: No lower extremity edema, non tender, no erythema  Right elbow exam shows the patient does have some tenderness to palpation over the lateral epicondylar region.  Patient has improvement in strength.  Good grip strength.  Full range of motion of the elbow noted.  Limited musculoskeletal ultrasound was performed and interpreted by  Limited ultrasound of patient's right common extensor tendon shows the patient does have still some tendon irregularity that is consistent with a tear.  Possibly over 50% improvement in size of tear.  Does have an increase in neovascularization in the area. Impression: Mild interval healing noted.    Impression and Recommendations:     The above documentation has been reviewed and is accurate and complete Sandra Saa, DO

## 2020-04-16 ENCOUNTER — Ambulatory Visit: Payer: Self-pay

## 2020-04-16 ENCOUNTER — Other Ambulatory Visit: Payer: Self-pay

## 2020-04-16 ENCOUNTER — Ambulatory Visit (INDEPENDENT_AMBULATORY_CARE_PROVIDER_SITE_OTHER): Payer: BC Managed Care – PPO | Admitting: Family Medicine

## 2020-04-16 ENCOUNTER — Encounter: Payer: Self-pay | Admitting: Family Medicine

## 2020-04-16 VITALS — BP 118/82 | HR 96 | Ht 66.0 in | Wt 156.0 lb

## 2020-04-16 DIAGNOSIS — M25521 Pain in right elbow: Secondary | ICD-10-CM | POA: Diagnosis not present

## 2020-04-16 DIAGNOSIS — M7711 Lateral epicondylitis, right elbow: Secondary | ICD-10-CM

## 2020-04-16 NOTE — Patient Instructions (Signed)
MRI right elbow Kathryne Sharper [phone]

## 2020-04-16 NOTE — Progress Notes (Signed)
[name] [address] [place] [address] Phone: [phone] Subjective:   Sandra Stanton, am serving as a scribe for Dr. Antoine Primas. This visit occurred during the SARS-CoV-2 public health emergency.  Safety protocols were in place, including screening questions prior to the visit, additional usage of staff PPE, and extensive cleaning of exam room while observing appropriate contact time as indicated for disinfecting solutions.   I'm seeing this patient by the request  of:  Scifres, Peter Minium  CC: Right elbow pain follow-up  [id number]   03/23/2020 Therapy was done and it does show that there is some mild healing noted.  Patient is to continue the medications including vitamin D.  Patient did not do the home exercises as regularly as she was supposed to.  Patient is going to start increasing activity slowly.  Patient will follow up with me again  Update 04/16/2020 Sandra Stanton is a 59 y.o. female coming in with complaint of right elbow pain. Patient states that when she uses the arm her pain increases. Has been doing a lot of cutting lately which has aggravated her pain.  Patient states that maybe she was making improvement but very difficult to know for sure.  Still very is localized to the lateral aspect the elbow.       No past medical history on file. Past Surgical History:  Procedure Laterality Date  . BREAST BIOPSY  2008  . CESAREAN SECTION  [id number]  . GALLBLADDER SURGERY  2007   Social History   Socioeconomic History  . Marital status: Married    Spouse name: Not on file  . Number of children: Not on file  . Years of education: Not on file  . Highest education level: Not on file  Occupational History  . Not on file  Tobacco Use  . Smoking status: Former Games developer  . Smokeless tobacco: Never Used  Substance and Sexual Activity  . Alcohol use: Not on file  . Drug use: Not on file  . Sexual activity: Not on  file  Other Topics Concern  . Not on file  Social History Narrative  . Not on file   Social Determinants of Health   Financial Resource Strain: Not on file  Food Insecurity: Not on file  Transportation Needs: Not on file  Physical Activity: Not on file  Stress: Not on file  Social Connections: Not on file   Not on File No family history on file.       Current Outpatient Medications (Other):  .  gabapentin (NEURONTIN) 100 MG capsule, TAKE 2 CAPSULES(200 MG) BY MOUTH AT BEDTIME .  Vitamin D, Ergocalciferol, (DRISDOL) 50000 units CAPS capsule, Take 1 capsule (50,000 Units total) by mouth every 7 (seven) days.   Reviewed prior external information including notes and imaging from  primary care provider As well as notes that were available from care everywhere and other healthcare systems.  Past medical history, social, surgical and family history all reviewed in electronic medical record.  No pertanent information unless stated regarding to the chief complaint.   Review of Systems:  No headache, visual changes, nausea, vomiting, diarrhea, constipation, dizziness, abdominal pain, skin rash, fevers, chills, night sweats, weight loss, swollen lymph nodes, body aches, joint swelling, chest pain, shortness of breath, mood changes. POSITIVE muscle aches  Objective  Blood pressure 118/82, pulse 96, height 5\' 6"  (1.676 m), weight 156 lb (70.8 kg), SpO2 97 %.   General: No apparent distress  alert and oriented x3 mood and affect normal, dressed appropriately.  HEENT: Pupils equal, extraocular movements intact  Respiratory: Patient's speak in full sentences and does not appear short of breath  Cardiovascular: No lower extremity edema, non tender, no erythema  Neuro: Cranial nerves II through XII are intact, neurovascularly intact in all extremities with 2+ DTRs and 2+ pulses.  Gait normal with good balance and coordination.  MSK: Right elbow exam shows the patient is still severely  tender over the lateral epicondylar region.  Still having pain with resisted wrist extension against force.  Good grip strength are noted.  No swelling no noted around the elbow and does have full range of motion.  Limited musculoskeletal ultrasound was performed and interpreted by Judi Saa  Limited ultrasound shows the patient does not have significant healing at the moment.  Still has what appears to be near full-thickness tear of the common extensor tendon.  Patient does also have increasing Doppler flow in neovascularization that seems to be in tendon. Impression: No significant healing from last scan.   Impression and Recommendations:     The above documentation has been reviewed and is accurate and complete Judi Saa, DO

## 2020-04-16 NOTE — Assessment & Plan Note (Signed)
Patient unfortunately has not made as much improvement as anticipated from the PRP at this time.  I do feel that an MRI is necessary.  This is affecting daily activity and patient's job performance.  Depending on MRI we will see how patient is doing but unfortunately I do think surgical intervention may be necessary at this time has failed all other conservative therapy including formal physical therapy

## 2020-04-17 ENCOUNTER — Encounter: Payer: Self-pay | Admitting: Family Medicine

## 2020-04-26 ENCOUNTER — Ambulatory Visit (INDEPENDENT_AMBULATORY_CARE_PROVIDER_SITE_OTHER): Payer: BC Managed Care – PPO

## 2020-04-26 ENCOUNTER — Other Ambulatory Visit: Payer: Self-pay

## 2020-04-26 DIAGNOSIS — S56511A Strain of other extensor muscle, fascia and tendon at forearm level, right arm, initial encounter: Secondary | ICD-10-CM | POA: Diagnosis not present

## 2020-04-26 DIAGNOSIS — M7711 Lateral epicondylitis, right elbow: Secondary | ICD-10-CM | POA: Diagnosis not present

## 2020-04-26 DIAGNOSIS — M25521 Pain in right elbow: Secondary | ICD-10-CM

## 2020-04-26 DIAGNOSIS — M66221 Spontaneous rupture of extensor tendons, right upper arm: Secondary | ICD-10-CM

## 2020-04-29 ENCOUNTER — Ambulatory Visit: Payer: Self-pay

## 2020-04-29 ENCOUNTER — Other Ambulatory Visit: Payer: Self-pay

## 2020-04-29 ENCOUNTER — Ambulatory Visit (INDEPENDENT_AMBULATORY_CARE_PROVIDER_SITE_OTHER): Payer: BC Managed Care – PPO | Admitting: Family Medicine

## 2020-04-29 ENCOUNTER — Encounter: Payer: Self-pay | Admitting: Family Medicine

## 2020-04-29 VITALS — BP 118/80 | HR 85 | Ht 66.0 in | Wt 156.0 lb

## 2020-04-29 DIAGNOSIS — M25521 Pain in right elbow: Secondary | ICD-10-CM

## 2020-04-29 DIAGNOSIS — M7711 Lateral epicondylitis, right elbow: Secondary | ICD-10-CM

## 2020-04-29 NOTE — Progress Notes (Signed)
[name] [address] [place] [address] Phone: [phone] Subjective:   [name], am serving as a scribe for Dr. Antoine Primas. This visit occurred during the SARS-CoV-2 public health emergency.  Safety protocols were in place, including screening questions prior to the visit, additional usage of staff PPE, and extensive cleaning of exam room while observing appropriate contact time as indicated for disinfecting solutions.   I'm seeing this patient by the request  of:  Scifres, Peter Minium  CC: Right elbow pain follow-up  [id number]   12/9/20201 Patient unfortunately has not made as much improvement as anticipated from the PRP at this time.  I do feel that an MRI is necessary.  This is affecting daily activity and patient's job performance.  Depending on MRI we will see how patient is doing but unfortunately I do think surgical intervention may be necessary at this time has failed all other conservative therapy including formal physical therapy  Update 04/29/2020 Sandra Stanton is a 59 y.o. female coming in with complaint of right elbow pain. Patient states that her pain is the same as last visit. Has not been cutting with the right arm. Does try to work through some of her pain though.  Patient is wondering how much activity she can do and what else to do to get this all the way better.  Patient did notice some mild improvement initially with the PRP but then when she increased the activity started having worsening.  Sent for an MRI     MRI of the elbow showed mild low grade tear of the common extensor tendon   No past medical history on file. Past Surgical History:  Procedure Laterality Date  . BREAST BIOPSY  2008  . CESAREAN SECTION  [id number]  . GALLBLADDER SURGERY  2007   Social History   Socioeconomic History  . Marital status: Married    Spouse name: Not on file  . Number of children: Not on file  . Years of  education: Not on file  . Highest education level: Not on file  Occupational History  . Not on file  Tobacco Use  . Smoking status: Former Games developer  . Smokeless tobacco: Never Used  Substance and Sexual Activity  . Alcohol use: Not on file  . Drug use: Not on file  . Sexual activity: Not on file  Other Topics Concern  . Not on file  Social History Narrative  . Not on file   Social Determinants of Health   Financial Resource Strain: Not on file  Food Insecurity: Not on file  Transportation Needs: Not on file  Physical Activity: Not on file  Stress: Not on file  Social Connections: Not on file   Not on File No family history on file.       Current Outpatient Medications (Other):  .  gabapentin (NEURONTIN) 100 MG capsule, TAKE 2 CAPSULES(200 MG) BY MOUTH AT BEDTIME .  Vitamin D, Ergocalciferol, (DRISDOL) 50000 units CAPS capsule, Take 1 capsule (50,000 Units total) by mouth every 7 (seven) days.   Reviewed prior external information including notes and imaging from  primary care provider As well as notes that were available from care everywhere and other healthcare systems.  Past medical history, social, surgical and family history all reviewed in electronic medical record.  No pertanent information unless stated regarding to the chief complaint.   Review of Systems:  No headache, visual changes, nausea, vomiting, diarrhea, constipation, dizziness, abdominal  pain, skin rash, fevers, chills, night sweats, weight loss, swollen lymph nodes, body aches, joint swelling, chest pain, shortness of breath, mood changes. POSITIVE muscle aches  Objective  Blood pressure 118/80, pulse 85, height 5\' 6"  (1.676 m), weight 156 lb (70.8 kg), SpO2 98 %.   General: No apparent distress alert and oriented x3 mood and affect normal, dressed appropriately.  HEENT: Pupils equal, extraocular movements intact  Respiratory: Patient's speak in full sentences and does not appear short of breath   Cardiovascular: No lower extremity edema, non tender, no erythema  Neuro: Cranial nerves II through XII are intact, neurovascularly intact in all extremities with 2+ DTRs and 2+ pulses.  Gait normal with good balance and coordination.  MSK:  Non tender with full range of motion and good stability and symmetric strength and tone of shoulders, , wrist, hip, knee and ankles bilaterally.  Right elbow shows the patient is still tender over the lateral epicondylar region but no significant swelling.  Worsening pain with resisted extension of the wrist.   Impression and Recommendations:     The above documentation has been reviewed and is accurate and complete , DO

## 2020-04-29 NOTE — Patient Instructions (Signed)
[facility] Continue compression w work Be easy at work with arm See me again in 6 weeks

## 2020-04-29 NOTE — Assessment & Plan Note (Signed)
Patient's MRI does show the patient does have a low-grade tear.  Encourage patient to continue the conservative therapy with start with home exercises and start with formal physical therapy.  Patient states that she can continue to modify her work environment.  Patient did not want a work note but encouraged if she needs when to contact us.  Patient will do well on pressure with the physical therapy and follow-up with me again in 6 weeks

## 2020-05-27 ENCOUNTER — Encounter: Payer: Self-pay | Admitting: Physical Therapy

## 2020-05-27 ENCOUNTER — Other Ambulatory Visit: Payer: Self-pay

## 2020-05-27 ENCOUNTER — Ambulatory Visit: Payer: BC Managed Care – PPO | Admitting: Physical Therapy

## 2020-05-27 ENCOUNTER — Ambulatory Visit: Payer: 59 | Attending: Family Medicine | Admitting: Physical Therapy

## 2020-05-27 DIAGNOSIS — M6281 Muscle weakness (generalized): Secondary | ICD-10-CM | POA: Diagnosis present

## 2020-05-27 DIAGNOSIS — M25521 Pain in right elbow: Secondary | ICD-10-CM | POA: Diagnosis present

## 2020-05-27 NOTE — Patient Instructions (Addendum)
Trigger Point Dry Needling  . What is Trigger Point Dry Needling (DN)? o DN is a physical therapy technique used to treat muscle pain and dysfunction. Specifically, DN helps deactivate muscle trigger points (muscle knots).  o A thin filiform needle is used to penetrate the skin and stimulate the underlying trigger point. The goal is for a local twitch response (LTR) to occur and for the trigger point to relax. No medication of any kind is injected during the procedure.   . What Does Trigger Point Dry Needling Feel Like?  o The procedure feels different for each individual patient. Some patients report that they do not actually feel the needle enter the skin and overall the process is not painful. Very mild bleeding may occur. However, many patients feel a deep cramping in the muscle in which the needle was inserted. This is the local twitch response.   . How Will I feel after the treatment? o Soreness is normal, and the onset of soreness may not occur for a few hours. Typically this soreness does not last longer than two days.  o Bruising is uncommon, however; ice can be used to decrease any possible bruising.  o In rare cases feeling tired or nauseous after the treatment is normal. In addition, your symptoms may get worse before they get better, this period will typically not last longer than 24 hours.   . What Can I do After My Treatment? o Increase your hydration by drinking more water for the next 24 hours. o You may place ice or heat on the areas treated that have become sore, however, do not use heat on inflamed or bruised areas. Heat often brings more relief post needling. o You can continue your regular activities, but vigorous activity is not recommended initially after the treatment for 24 hours. o DN is best combined with other physical therapy such as strengthening, stretching, and other therapies.    [facility] [address], [address] [place], [place]  [address] Phone # [phone] Fax [phone]   IONTOPHORESIS PATIENT PRECAUTIONS & CONTRAINDICATIONS:  . Redness under one or both electrodes can occur.  This characterized by a uniform redness that usually disappears within 12 hours of treatment. . Small pinhead size blisters may result in response to the drug.  Contact your physician if the problem persists more than 24 hours. . On rare occasions, iontophoresis therapy can result in temporary skin reactions such as rash, inflammation, irritation or burns.  The skin reactions may be the result of individual sensitivity to the ionic solution used, the condition of the skin at the start of treatment, reaction to the materials in the electrodes, allergies or sensitivity to dexamethasone, or a poor connection between the patch and your skin.  Discontinue using iontophoresis if you have any of these reactions and report to your therapist. . Remove the Patch or electrodes if you have any undue sensation of pain or burning during the treatment and report discomfort to your therapist. . Tell your Therapist if you have had known adverse reactions to the application of electrical current. . If using the Patch, the LED light will turn off when treatment is complete and the patch can be removed.  Approximate treatment time is 1-3 hours.  Remove the patch when light goes off or after 6 hours. . The Patch can be worn during normal activity, however excessive motion where the electrodes have been placed can cause poor contact between the skin and the electrode or uneven electrical   current resulting in greater risk of skin irritation. . Keep out of the reach of children.   . DO NOT use if you have a cardiac pacemaker or any other electrically sensitive implanted device. . DO NOT use if you have a known sensitivity to dexamethasone. . DO NOT use during Magnetic Resonance Imaging (MRI). . DO NOT use over broken or compromised skin (e.g. sunburn, cuts, or acne)  due to the increased risk of skin reaction. . DO NOT SHAVE over the area to be treated:  To establish good contact between the Patch and the skin, excessive hair may be clipped. . DO NOT place the Patch or electrodes on or over your eyes, directly over your heart, or brain. . DO NOT reuse the Patch or electrodes as this may cause burns to occur.  

## 2020-05-27 NOTE — Therapy (Signed)
[facility] [address], [address] [place], [place], [address] Phone: [phone]   Fax:  [phone]  Physical Therapy Evaluation  Patient Details  Name: Sandra Stanton MRN: [id number] Date of Birth: 1961/03/28 Referring Provider (PT): Judi Saa, DO   Encounter Date: 05/27/2020   PT End of Session - 05/27/20 1240    Visit Number 1    Date for PT Re-Evaluation 07/22/20    PT Start Time 1148    PT Stop Time 1230    PT Time Calculation (min) 42 min    Activity Tolerance Patient tolerated treatment well    Behavior During Therapy [facility] for tasks assessed/performed           History reviewed. No pertinent past medical history.  Past Surgical History:  Procedure Laterality Date  . BREAST BIOPSY  2008  . CESAREAN SECTION  [id number]  . GALLBLADDER SURGERY  2007    There were no vitals filed for this visit.    Subjective Assessment - 05/27/20 1152    Subjective Pt states she had a fall slipping on icy stairs last year.  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  When lifting certain things it causes me to stop such as trying to lift a table at work. Currently not lifting or weight training because of the pain.    Limitations Lifting    Patient Stated Goals be able to lift and reduce pain    Currently in Pain? Yes    Pain Score 2     Pain Location Elbow    Pain Orientation Right;Lateral    Pain Descriptors / Indicators Sore;Sharp    Pain Type Chronic pain    Pain Frequency Intermittent    Aggravating Factors  lifting heavy or lifting a lot and repetive lifting    Pain Relieving Factors limiting activities; ice    Multiple Pain Sites No              [facility] PT Assessment - 05/27/20 0001      Assessment   Medical Diagnosis M25.521 (ICD-10-CM) - Right elbow pain; M77.11 (ICD-10-CM) - Lateral epicondylitis of right elbow    Referring Provider (PT) Judi Saa, DO    Onset  Date/Surgical Date --   Nov/Dec 2020   Prior Therapy No      Precautions   Precautions None      Balance Screen   Has the patient fallen in the past 6 months No      Home Environment   Living Environment Private residence    Living Arrangements Spouse/significant other      Prior Function   Level of Independence Independent    Vocation Part time employment    Stage manager, lifting      Cognition   Overall Cognitive Status Within Functional Limits for tasks assessed      Observation/Other Assessments   Focus on Therapeutic Outcomes (FOTO)  48% functional      Posture/Postural Control   Posture/Postural Control Postural limitations    Postural Limitations Rounded Shoulders      ROM / Strength   AROM / PROM / Strength AROM;Strength      AROM   Overall AROM Comments equal bil wrist, shoulder, elbow +pain with wrist extension      Strength   Overall Strength Comments bil shoulder 5/5    Strength Assessment Site Wrist;Elbow    Right/Left Elbow Right  Right Elbow Flexion 4/5   pain with thumb up and palm up; more pain with palm down   Right/Left Wrist Right    Right Wrist Flexion 5/5    Right Wrist Extension 4-/5   pain     Palpation   Palpation comment tight wrist extensors, tight brachioradialis and triceps Rt side; TTP Rt wrist ext attachments      Special Tests    Special Tests Cervical;Rotator Cuff Impingement    Cervical Tests Spurling's    Rotator Cuff Impingment tests Neer impingement test;Hawkins- Kennedy test      Spurling's   Findings Negative      Neer Impingement test    Findings Negative      Hawkins-Kennedy test   Findings Negative                      Objective measurements completed on examination: See above findings.               PT Education - 05/27/20 1235    Education Details DN info and demo of stretches for wrist extensors wrist stretch and stretching one digit at a time    Person(s)  Educated Patient    Methods Explanation;Handout    Comprehension Verbalized understanding;Returned demonstration            PT Short Term Goals - 05/27/20 1250      PT SHORT TERM GOAL #1   Title pt reports 25% decrease pain after typical work day    Time 4    Period Weeks    Status New    Target Date 06/24/20      PT SHORT TERM GOAL #2   Title ind with intial HEP and without increased pain    Time 4    Period Weeks    Status New    Target Date 06/24/20      PT SHORT TERM GOAL #3   Title Get grip strength measurement    Time 2    Period Weeks    Status New    Target Date 06/10/20      PT SHORT TERM GOAL #4   Title Improve grip strength to be at least 3lb more on dominent Rt hand than on Lt hand    Time 6    Period Weeks    Status New    Target Date 07/08/20             PT Long Term Goals - 05/27/20 1252      PT LONG TERM GOAL #1   Title Improve posture and alignment with patient demonstrating correctly engaged posterior shoudler girdle    Time 8    Period Weeks    Status New    Target Date 07/22/20      PT LONG TERM GOAL #2   Title MMT wrist extension of at least 4+/5 no pain    Time 8    Period Weeks    Status New    Target Date 07/22/20      PT LONG TERM GOAL #3   Title Decrease pain Rt elbow by at least 75% with functional activities    Time 8    Period Weeks    Status New    Target Date 07/22/20      PT LONG TERM GOAL #4   Title Return to normal functional activities with independent advanced HEP    Time 8    Period Weeks  Status New    Target Date 07/22/20      PT LONG TERM GOAL #5   Title Improve FOTO to 63% functional up from 48%    Time 8    Period Weeks    Status New    Target Date 07/22/20                  Plan - 05/27/20 1242    Clinical Impression Statement Pt presents to clinic today due to ongoing and worsening Rt elbow pain that started when falling over a year ago on ice.  Pt said it started very mild and  though it would go away but never did and now is not able to use her arm as normally for lifting. Pt is active and lifts weights as well as has active job that involves lifting.  Pt was cleared for cervical and shoulder symptoms with negative spurling and negative RTC and shoulder impingement related symptoms.  Pt does have TTP of lateral epicondyle mostly wrist extensors and a little to the tricep attachments.  No pain with MMT of tricep muscle, definite pain with MMT of wrist extensors.  Mild pain with MMT to bicep and brachioradialis muscles.  Pt has trigger point palpated in wrist extensor proximal to the elbow.  Pt will benefit from skilled therapy to address soft tissue and strength deficits as well as for pain management so she can return to maximum funciton at work and return to her normal exercise routine for healthy lifestyle.    Personal Factors and Comorbidities Time since onset of injury/illness/exacerbation;Profession    Examination-Activity Limitations Carry;Lift    Examination-Participation Restrictions Community Activity;Occupation    Stability/Clinical Decision Making Evolving/Moderate complexity    Clinical Decision Making Moderate    Rehab Potential Excellent    PT Frequency 2x / week    PT Duration 8 weeks   in case of scheduling issues, rec for 6 weeks to start   PT Treatment/Interventions ADLs/Self Care Home Management;Cryotherapy;Electrical Stimulation;Moist Heat;Ultrasound;Therapeutic activities;Neuromuscular re-education;Therapeutic exercise;Patient/family education;Taping;Manual techniques;Dry needling;Iontophoresis 4mg /ml Dexamethasone    PT Next Visit Plan STM and DN to wrist extensors, ionto if order signed, progress strength as tolerated    PT Home Exercise Plan verbal review and demo of wrist stretches; needs ionto info    Consulted and Agree with Plan of Care Patient           Patient will benefit from skilled therapeutic intervention in order to improve the  following deficits and impairments:  Pain,Impaired UE functional use,Increased fascial restricitons,Decreased strength,Increased muscle spasms  Visit Diagnosis: Pain in right elbow  Muscle weakness (generalized)     Problem List Patient Active Problem List   Diagnosis Date Noted  . Lateral epicondylitis of right elbow 12/26/2019  . Neck pain 12/26/2019  . Patellofemoral syndrome of left knee 06/26/2018  . Adhesive capsulitis of right shoulder 09/19/2017  . Chronic right shoulder pain 06/05/2017    06/07/2017, PT 05/27/2020, 1:07 PM  [facility] [address] [place], [place], [place] Phone: [phone]   Fax:  [phone]  Name: Sandra Stanton MRN: [name] Date of Birth: 13-Jul-1960

## 2020-06-03 ENCOUNTER — Other Ambulatory Visit: Payer: Self-pay

## 2020-06-03 ENCOUNTER — Encounter: Payer: Self-pay | Admitting: Family Medicine

## 2020-06-03 ENCOUNTER — Ambulatory Visit: Payer: Self-pay

## 2020-06-03 ENCOUNTER — Ambulatory Visit (INDEPENDENT_AMBULATORY_CARE_PROVIDER_SITE_OTHER): Payer: 59 | Admitting: Family Medicine

## 2020-06-03 VITALS — BP 110/80 | HR 82 | Ht 66.0 in | Wt 158.0 lb

## 2020-06-03 DIAGNOSIS — M7711 Lateral epicondylitis, right elbow: Secondary | ICD-10-CM

## 2020-06-03 DIAGNOSIS — M25521 Pain in right elbow: Secondary | ICD-10-CM

## 2020-06-03 NOTE — Patient Instructions (Signed)
Great to see you  Overall still very mild improvement,  Try after this week take a little step back  Lift only with thumbs up  Continue with PT  See me again in 5-6 weeks and if not perfect we will need to consider surgery

## 2020-06-03 NOTE — Assessment & Plan Note (Signed)
Patient does have the low-grade tear noted of the common extensor tendon.  Patient though is going to do physical therapy and has only been 1 time.  Patient will continue with this at the moment and will follow up with me again in 6 to 8 weeks.  Discussed that if this continues to give her difficulty will need to consider the possibility of surgical intervention.  Patient is in agreement with the plan.

## 2020-06-03 NOTE — Progress Notes (Signed)
[name] [address] [place] [address] Phone: [phone] Subjective:   Sandra Stanton, am serving as a scribe for Dr. Antoine Primas. This visit occurred during the SARS-CoV-2 public health emergency.  Safety protocols were in place, including screening questions prior to the visit, additional usage of staff PPE, and extensive cleaning of exam room while observing appropriate contact time as indicated for disinfecting solutions.   I'm seeing this patient by the request  of:  Scifres, Peter Minium  CC: Right elbow pain follow-up  [id number]   04/29/2020 Patient's MRI does show the patient does have a low-grade tear.  Encourage patient to continue the conservative therapy with start with home exercises and start with formal physical therapy.  Patient states that she can continue to modify her work environment.  Patient did not want a work note but encouraged if she needs when to contact us.  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 female coming in with complaint of right elbow pain. Patient has gone to one session of PT due to scheduling. Next session is next Tuesday. Patient states that she has not noticed a change in symptoms.  Patient does have a low-grade tear noted of the common extensor tendon but no significant retraction noted.  Patient has been attempting to try to avoid things at work when possible.  Patient does feel like she has made very mild progress.  Has not done the physical therapy as much as she would want yet.      No past medical history on file. Past Surgical History:  Procedure Laterality Date  . BREAST BIOPSY  2008  . CESAREAN SECTION  [id number]  . GALLBLADDER SURGERY  2007   Social History   Socioeconomic History  . Marital status: Married    Spouse name: Not on file  . Number of children: Not on file  .  Years of education: Not on file  . Highest education level: Not on file  Occupational History  . Not on file  Tobacco Use  . Smoking status: Former Games developer  . Smokeless tobacco: Never Used  Substance and Sexual Activity  . Alcohol use: Not on file  . Drug use: Not on file  . Sexual activity: Not on file  Other Topics Concern  . Not on file  Social History Narrative  . Not on file   Social Determinants of Health   Financial Resource Strain: Not on file  Food Insecurity: Not on file  Transportation Needs: Not on file  Physical Activity: Not on file  Stress: Not on file  Social Connections: Not on file   Not on File No family history on file.       Current Outpatient Medications (Other):  .  gabapentin (NEURONTIN) 100 MG capsule, TAKE 2 CAPSULES(200 MG) BY MOUTH AT BEDTIME .  Vitamin D, Ergocalciferol, (DRISDOL) 50000 units CAPS capsule, Take 1 capsule (50,000 Units total) by mouth every 7 (seven) days.   Reviewed prior external information including notes and imaging from  primary care provider As well as notes that were available from care everywhere and other healthcare systems.  Past medical history, social, surgical and family history all reviewed in electronic medical record.  No pertanent information unless stated regarding to the chief complaint.   Review of Systems:  No headache, visual changes, nausea, vomiting, diarrhea, constipation, dizziness,  abdominal pain, skin rash, fevers, chills, night sweats, weight loss, swollen lymph nodes, body aches, joint swelling, chest pain, shortness of breath, mood changes. POSITIVE muscle aches  Objective  Blood pressure 110/80, pulse 82, height 5\' 6"  (1.676 m), weight 158 lb (71.7 kg), SpO2 98 %.   General: No apparent distress alert and oriented x3 mood and affect normal, dressed appropriately.  HEENT: Pupils equal, extraocular movements intact  Respiratory: Patient's speak in full sentences and does not appear short of  breath  Cardiovascular: No lower extremity edema, non tender, no erythema  Gait normal with good balance and coordination.  MSK: Right elbow exam shows the patient does have some tenderness still over the lateral epicondylar region.  Some pain with resisted extension.  Patient does have good grip strength though.  Patient does have full range of motion of the elbow.  Limited musculoskeletal ultrasound was performed and interpreted by  Limited ultrasound of patient's right common extensor tendon shows shows the patient does have a partial thickness tear noted. No significant retraction noted.  Decrease in hypoechoic changes but increasing neovascularization and continued Doppler flow that is consistent with tendinitis. Impression: Minimal change may be some mild decrease in swelling    Impression and Recommendations:     The above documentation has been reviewed and is accurate and complete Judi Saa, DO

## 2020-06-09 ENCOUNTER — Ambulatory Visit: Payer: 59 | Admitting: Physical Therapy

## 2020-06-11 ENCOUNTER — Ambulatory Visit: Payer: 59 | Attending: Family Medicine | Admitting: Physical Therapy

## 2020-06-11 ENCOUNTER — Encounter: Payer: Self-pay | Admitting: Physical Therapy

## 2020-06-11 ENCOUNTER — Other Ambulatory Visit: Payer: Self-pay

## 2020-06-11 DIAGNOSIS — M25521 Pain in right elbow: Secondary | ICD-10-CM | POA: Insufficient documentation

## 2020-06-11 DIAGNOSIS — M6281 Muscle weakness (generalized): Secondary | ICD-10-CM | POA: Diagnosis present

## 2020-06-11 NOTE — Therapy (Signed)
[facility] [address], [address] [place], [place], [address] Phone: [phone]   Fax:  [phone]  Physical Therapy Treatment  Patient Details  Name: Sandra Stanton MRN: [id number] Date of Birth: December 20, 1960 Referring Provider (PT): Judi Saa, DO   Encounter Date: 06/11/2020   PT End of Session - 06/11/20 1539    Visit Number 2    Date for PT Re-Evaluation 07/22/20    PT Start Time 1150    PT Stop Time 1230    PT Time Calculation (min) 40 min    Activity Tolerance Patient tolerated treatment well    Behavior During Therapy [facility] for tasks assessed/performed           History reviewed. No pertinent past medical history.  Past Surgical History:  Procedure Laterality Date  . BREAST BIOPSY  2008  . CESAREAN SECTION  [id number]  . GALLBLADDER SURGERY  2007    There were no vitals filed for this visit.   Subjective Assessment - 06/11/20 1154    Subjective Pt states that things are about the same since her eval. She had to do alot of packing over the weekend which causes alot of soreness. She has mild discomfort right now.    Limitations Lifting    Patient Stated Goals be able to lift and reduce pain    Currently in Pain? Yes    Pain Score 5     Pain Location Elbow    Pain Orientation Right    Pain Descriptors / Indicators Aching    Pain Type Chronic pain    Pain Radiating Towards none                             [facility] Adult PT Treatment/Exercise - 06/11/20 0001      Exercises   Exercises Wrist      Wrist Exercises   Wrist Extension Strengthening;10 reps;Right    Bar Weights/Barbell (Wrist Extension) 1 lb    Wrist Extension Limitations eccentric    Wrist Radial Deviation Right;Strengthening;10 reps    Bar Weights/Barbell (Radial Deviation) 1 lb    Wrist Radial Deviation Limitations eccentric    Other wrist exercises Rt pronation/supination x10 reps #1      Modalities    Modalities Cryotherapy;Iontophoresis      Cryotherapy   Number Minutes Cryotherapy 5 Minutes    Cryotherapy Location --   elbow   Type of Cryotherapy Ice pack      Iontophoresis   Type of Iontophoresis Dexamethasone    Location Rt lateral epichondyle    Dose 4 mg/mL    Time 4 hour patch      Manual Therapy   Manual Therapy Soft tissue mobilization    Soft tissue mobilization STM Rt wrist extensors            Trigger Point Dry Needling - 06/11/20 0001    Consent Given? Yes    Education Handout Provided Previously provided    Muscles Treated Wrist/Hand Extensor digitorum;Extensor carpi ulnaris    Extensor digitorum Response Twitch response elicited;Palpable increased muscle length   Rt   Extensor carpi ulnaris Response Palpable increased muscle length;Twitch response elicited   Rt               PT Education - 06/11/20 1545    Education Details intophoresis info; HEP    Person(s) Educated Patient    Methods Explanation;Handout    Comprehension  Verbalized understanding            PT Short Term Goals - 05/27/20 1250      PT SHORT TERM GOAL #1   Title pt reports 25% decrease pain after typical work day    Time 4    Period Weeks    Status New    Target Date 06/24/20      PT SHORT TERM GOAL #2   Title ind with intial HEP and without increased pain    Time 4    Period Weeks    Status New    Target Date 06/24/20      PT SHORT TERM GOAL #3   Title Get grip strength measurement    Time 2    Period Weeks    Status New    Target Date 06/10/20      PT SHORT TERM GOAL #4   Title Improve grip strength to be at least 3lb more on dominent Rt hand than on Lt hand    Time 6    Period Weeks    Status New    Target Date 07/08/20             PT Long Term Goals - 05/27/20 1252      PT LONG TERM GOAL #1   Title Improve posture and alignment with patient demonstrating correctly engaged posterior shoudler girdle    Time 8    Period Weeks    Status New     Target Date 07/22/20      PT LONG TERM GOAL #2   Title MMT wrist extension of at least 4+/5 no pain    Time 8    Period Weeks    Status New    Target Date 07/22/20      PT LONG TERM GOAL #3   Title Decrease pain Rt elbow by at least 75% with functional activities    Time 8    Period Weeks    Status New    Target Date 07/22/20      PT LONG TERM GOAL #4   Title Return to normal functional activities with independent advanced HEP    Time 8    Period Weeks    Status New    Target Date 07/22/20      PT LONG TERM GOAL #5   Title Improve FOTO to 63% functional up from 48%    Time 8    Period Weeks    Status New    Target Date 07/22/20                 Plan - 06/11/20 1545    Clinical Impression Statement Pt had to do more lifting over the weekend, and this resulted in soreness in the elbow within the following 48 hours. Her pain upon arrival is no worse than at her last session. Pt was able to complete gentle eccentric strengthening of the wrist extensors, and she was agreeable to dry needling. There were multiple twitch responses elicited and PT followed this with soft tissue moblization the area. PT applied iontophoresis patch end of session and pt verbalized understanding of signs of adverse reactions and wear time. No increase in elbow pain was reported end of today's session.    Personal Factors and Comorbidities Time since onset of injury/illness/exacerbation;Profession    Examination-Activity Limitations Carry;Lift    Examination-Participation Restrictions Community Activity;Occupation    Stability/Clinical Decision Making Evolving/Moderate complexity    Rehab Potential Excellent  PT Frequency 2x / week    PT Duration 8 weeks   in case of scheduling issues, rec for 6 weeks to start   PT Treatment/Interventions ADLs/Self Care Home Management;Cryotherapy;Electrical Stimulation;Moist Heat;Ultrasound;Therapeutic activities;Neuromuscular re-education;Therapeutic  exercise;Patient/family education;Taping;Manual techniques;Dry needling;Iontophoresis 4mg /ml Dexamethasone    PT Next Visit Plan STM and DN to wrist extensors, ionto #2 if needed, progress strength as tolerated    PT Home Exercise Plan [id number]    Consulted and Agree with Plan of Care Patient           Patient will benefit from skilled therapeutic intervention in order to improve the following deficits and impairments:  Pain,Impaired UE functional use,Increased fascial restricitons,Decreased strength,Increased muscle spasms  Visit Diagnosis: Pain in right elbow  Muscle weakness (generalized)     Problem List Patient Active Problem List   Diagnosis Date Noted  . Lateral epicondylitis of right elbow 12/26/2019  . Neck pain 12/26/2019  . Patellofemoral syndrome of left knee 06/26/2018  . Adhesive capsulitis of right shoulder 09/19/2017  . Chronic right shoulder pain 06/05/2017    3:54 PM,06/11/20 08/09/20 PT, DPT [facility]  [phone]  [facility] [address] [place], [place], [place] Phone: [phone]   Fax:  [phone]  Name: Sandra Stanton MRN: [name] Date of Birth: 09/19/1960

## 2020-06-16 ENCOUNTER — Ambulatory Visit: Payer: 59 | Admitting: Physical Therapy

## 2020-06-16 ENCOUNTER — Encounter: Payer: Self-pay | Admitting: Physical Therapy

## 2020-06-16 ENCOUNTER — Other Ambulatory Visit: Payer: Self-pay

## 2020-06-16 DIAGNOSIS — M25521 Pain in right elbow: Secondary | ICD-10-CM

## 2020-06-16 DIAGNOSIS — M6281 Muscle weakness (generalized): Secondary | ICD-10-CM

## 2020-06-16 NOTE — Patient Instructions (Signed)
    At the wall, pad elbow and upper arm with a towel.  Use left hand to glide outward.  While holding the pressure, lift wrist up/down 10x    Lavinia Sharps PT [facility] [address] [place], [place] [address] Phone # [phone] Fax [phone]

## 2020-06-16 NOTE — Therapy (Signed)
[facility] [address], [address] [place], [place], [address] Phone: [phone]   Fax:  [phone]  Physical Therapy Treatment  Patient Details  Name: Sandra Stanton MRN: [id number] Date of Birth: 1960/05/26 Referring Provider (PT): Judi Saa, DO   Encounter Date: 06/16/2020   PT End of Session - 06/16/20 1615    Visit Number 3    Date for PT Re-Evaluation 07/22/20    PT Start Time 1615    PT Stop Time 1653    PT Time Calculation (min) 38 min    Activity Tolerance Patient tolerated treatment well           History reviewed. No pertinent past medical history.  Past Surgical History:  Procedure Laterality Date  . BREAST BIOPSY  2008  . CESAREAN SECTION  [id number]  . GALLBLADDER SURGERY  2007    There were no vitals filed for this visit.   Subjective Assessment - 06/16/20 1615    Subjective It's the same.   I was a little sore after the DN.  Unsure if it helped or.  Ionto patch was itchy at first but then OK.  Ex's going OK.  I did eccentrics only 1x b/c it's been aggravated this week.  Both lifting palm down and palm up is painful.  Avoiding rotary cutter at work.    Patient Stated Goals be able to lift and reduce pain    Currently in Pain? Yes    Pain Score 5     Pain Location Elbow    Pain Orientation Right;Lateral    Pain Type Chronic pain    Aggravating Factors  lifting either palm down or palm up                             [facility] Adult PT Treatment/Exercise - 06/16/20 0001      Wrist Exercises   Wrist Extension Strengthening;10 reps;Right    Bar Weights/Barbell (Wrist Extension) 1 lb    Wrist Extension Limitations eccentric lowering    Other wrist exercises at the wall self lateral glide with wrist extension 2x 10    Other wrist exercises red Flexbar 10x      Iontophoresis   Type of Iontophoresis Dexamethasone    Location right lateral epicondyle    Dose 4 mg/mL #2     Time 4 hour patch      Manual Therapy   Manual therapy comments lateral glide mob with movement 10x    Soft tissue mobilization instrument assisted stainless steel instruments right extensor wad                  PT Education - 06/16/20 1650    Education Details self mob with movement at the wall with wrist extension    Person(s) Educated Patient    Methods Explanation;Demonstration;Handout    Comprehension Returned demonstration;Verbalized understanding            PT Short Term Goals - 05/27/20 1250      PT SHORT TERM GOAL #1   Title pt reports 25% decrease pain after typical work day    Time 4    Period Weeks    Status New    Target Date 06/24/20      PT SHORT TERM GOAL #2   Title ind with intial HEP and without increased pain    Time 4    Period Weeks    Status  New    Target Date 06/24/20      PT SHORT TERM GOAL #3   Title Get grip strength measurement    Time 2    Period Weeks    Status New    Target Date 06/10/20      PT SHORT TERM GOAL #4   Title Improve grip strength to be at least 3lb more on dominent Rt hand than on Lt hand    Time 6    Period Weeks    Status New    Target Date 07/08/20             PT Long Term Goals - 05/27/20 1252      PT LONG TERM GOAL #1   Title Improve posture and alignment with patient demonstrating correctly engaged posterior shoudler girdle    Time 8    Period Weeks    Status New    Target Date 07/22/20      PT LONG TERM GOAL #2   Title MMT wrist extension of at least 4+/5 no pain    Time 8    Period Weeks    Status New    Target Date 07/22/20      PT LONG TERM GOAL #3   Title Decrease pain Rt elbow by at least 75% with functional activities    Time 8    Period Weeks    Status New    Target Date 07/22/20      PT LONG TERM GOAL #4   Title Return to normal functional activities with independent advanced HEP    Time 8    Period Weeks    Status New    Target Date 07/22/20      PT LONG TERM GOAL #5    Title Improve FOTO to 63% functional up from 48%    Time 8    Period Weeks    Status New    Target Date 07/22/20                 Plan - 06/16/20 1646    Clinical Impression Statement Discussed benefit of eccentric strengthening to stimulate healing with review of previous HEP.  Improved extensor soft tissue mobility noted during manual therapy but continues to be point tender over lateral epicondyle.  Good response to self mobilization/lateral glide and eccentric strengthening with FlexBar.  Therapist monitoring response with all interventions.    Personal Factors and Comorbidities Time since onset of injury/illness/exacerbation;Profession    Rehab Potential Excellent    PT Frequency 2x / week    PT Duration 8 weeks    PT Treatment/Interventions ADLs/Self Care Home Management;Cryotherapy;Electrical Stimulation;Moist Heat;Ultrasound;Therapeutic activities;Neuromuscular re-education;Therapeutic exercise;Patient/family education;Taping;Manual techniques;Dry needling;Iontophoresis 4mg /ml Dexamethasone    PT Next Visit Plan STM  to wrist extensors, ionto #3 if needed, progress strength as tolerated    PT Home Exercise Plan [id number]           Patient will benefit from skilled therapeutic intervention in order to improve the following deficits and impairments:  Pain,Impaired UE functional use,Increased fascial restricitons,Decreased strength,Increased muscle spasms  Visit Diagnosis: Pain in right elbow  Muscle weakness (generalized)     Problem List Patient Active Problem List   Diagnosis Date Noted  . Lateral epicondylitis of right elbow 12/26/2019  . Neck pain 12/26/2019  . Patellofemoral syndrome of left knee 06/26/2018  . Adhesive capsulitis of right shoulder 09/19/2017  . Chronic right shoulder pain 06/05/2017   06/07/2017, PT 06/16/20 5:00 PM Phone: [phone]  Fax: [phone] Vivien Presto 06/16/2020, 5:00 PM  [facility] [address], [address] [place], [place], [address] Phone: [phone]   Fax:  [phone]  Name: Sandra Stanton MRN: [id number] Date of Birth: 01-10-1961

## 2020-06-18 ENCOUNTER — Ambulatory Visit: Payer: 59 | Admitting: Physical Therapy

## 2020-06-18 ENCOUNTER — Other Ambulatory Visit: Payer: Self-pay

## 2020-06-18 DIAGNOSIS — M25521 Pain in right elbow: Secondary | ICD-10-CM | POA: Diagnosis not present

## 2020-06-18 DIAGNOSIS — M6281 Muscle weakness (generalized): Secondary | ICD-10-CM

## 2020-06-18 NOTE — Therapy (Signed)
[facility] [address] [place], [place], [address] Phone: [phone]   Fax:  [phone]  Physical Therapy Treatment  Patient Details  Name: Sandra Stanton MRN: [id number] Date of Birth: 1960/09/24 Referring Provider (PT): Judi Saa, DO   Encounter Date: 06/18/2020   PT End of Session - 06/18/20 1655    Visit Number 4    Date for PT Re-Evaluation 07/22/20    Authorization Type 30 visit limit    PT Start Time 1615    PT Stop Time 1653    PT Time Calculation (min) 38 min    Activity Tolerance Patient tolerated treatment well           No past medical history on file.  Past Surgical History:  Procedure Laterality Date  . BREAST BIOPSY  2008  . CESAREAN SECTION  [id number]  . GALLBLADDER SURGERY  2007    There were no vitals filed for this visit.   Subjective Assessment - 06/18/20 1617    Subjective When I woke up this morning it didn't hurt, usually it does.  Soreness after last treatment.  I like the instrument better than DN.  I might use my elbow brace.    Currently in Pain? Yes    Pain Location Elbow    Pain Orientation Right    Pain Type Chronic pain                             [facility] Adult PT Treatment/Exercise - 06/18/20 0001      Shoulder Exercises: Standing   Row Limitations 5# cuff weight rows at the counter 2x 10    Other Standing Exercises radial nerve gliding 10x      Wrist Exercises   Wrist Extension Strengthening;10 reps;Right    Bar Weights/Barbell (Wrist Extension) 1 lb    Wrist Extension Limitations eccentric lowering    Other wrist exercises at the wall self lateral glide with wrist extension 2x 10    Other wrist exercises red Flexbar 10x      Iontophoresis   Type of Iontophoresis Dexamethasone    Location right lateral epicondyle    Dose 4 mg/mL #3    Time 4 hour patch      Manual Therapy   Manual therapy comments lateral glide mob with  movement 10x    Soft tissue mobilization instrument assisted stainless steel instruments right extensor wad                  PT Education - 06/18/20 1654    Education Details tennis elbow strap recommendation; bent rows at the counter; radial nerve glides    Person(s) Educated Patient    Methods Explanation;Demonstration;Handout    Comprehension Returned demonstration;Verbalized understanding            PT Short Term Goals - 05/27/20 1250      PT SHORT TERM GOAL #1   Title pt reports 25% decrease pain after typical work day    Time 4    Period Weeks    Status New    Target Date 06/24/20      PT SHORT TERM GOAL #2   Title ind with intial HEP and without increased pain    Time 4    Period Weeks    Status New    Target Date 06/24/20      PT SHORT TERM GOAL #3   Title Get grip  strength measurement    Time 2    Period Weeks    Status New    Target Date 06/10/20      PT SHORT TERM GOAL #4   Title Improve grip strength to be at least 3lb more on dominent Rt hand than on Lt hand    Time 6    Period Weeks    Status New    Target Date 07/08/20             PT Long Term Goals - 05/27/20 1252      PT LONG TERM GOAL #1   Title Improve posture and alignment with patient demonstrating correctly engaged posterior shoudler girdle    Time 8    Period Weeks    Status New    Target Date 07/22/20      PT LONG TERM GOAL #2   Title MMT wrist extension of at least 4+/5 no pain    Time 8    Period Weeks    Status New    Target Date 07/22/20      PT LONG TERM GOAL #3   Title Decrease pain Rt elbow by at least 75% with functional activities    Time 8    Period Weeks    Status New    Target Date 07/22/20      PT LONG TERM GOAL #4   Title Return to normal functional activities with independent advanced HEP    Time 8    Period Weeks    Status New    Target Date 07/22/20      PT LONG TERM GOAL #5   Title Improve FOTO to 63% functional up from 48%    Time 8     Period Weeks    Status New    Target Date 07/22/20                 Plan - 06/18/20 1636    Clinical Impression Statement The patient is able to perform eccentric strengthening and self mobs with movement with minimal pain.  Initiated scapular/shoulder strengthening and neural glides also with minimal to no pain.  Symptom irritability much improved.  Good response to instrument assisted soft tissue mobilization.    Rehab Potential Excellent    PT Frequency 2x / week    PT Treatment/Interventions ADLs/Self Care Home Management;Cryotherapy;Electrical Stimulation;Moist Heat;Ultrasound;Therapeutic activities;Neuromuscular re-education;Therapeutic exercise;Patient/family education;Taping;Manual techniques;Dry needling;Iontophoresis 4mg /ml Dexamethasone    PT Next Visit Plan STM  to wrist extensors, ionto #4, progress strength as tolerated with eccentric wrist extension; pronation/supination;  and include shoulder and scapular muscles; check grip strength    PT Home Exercise Plan [id number]           Patient will benefit from skilled therapeutic intervention in order to improve the following deficits and impairments:  Pain,Impaired UE functional use,Increased fascial restricitons,Decreased strength,Increased muscle spasms  Visit Diagnosis: Pain in right elbow  Muscle weakness (generalized)     Problem List Patient Active Problem List   Diagnosis Date Noted  . Lateral epicondylitis of right elbow 12/26/2019  . Neck pain 12/26/2019  . Patellofemoral syndrome of left knee 06/26/2018  . Adhesive capsulitis of right shoulder 09/19/2017  . Chronic right shoulder pain 06/05/2017   06/07/2017, PT 06/18/20 5:00 PM Phone: [phone] Fax: [phone] 06/18/2020, 5:00 PM  [facility] [address] [place], [place], [place] Phone: [phone]   Fax:  [phone]  Name: Sandra Stanton  MRN:  [id number] Date of Birth: 06-16-1960

## 2020-06-23 ENCOUNTER — Ambulatory Visit: Payer: 59 | Admitting: Physical Therapy

## 2020-06-23 ENCOUNTER — Other Ambulatory Visit: Payer: Self-pay

## 2020-06-23 DIAGNOSIS — M6281 Muscle weakness (generalized): Secondary | ICD-10-CM

## 2020-06-23 DIAGNOSIS — M25521 Pain in right elbow: Secondary | ICD-10-CM

## 2020-06-23 NOTE — Therapy (Signed)
[facility] [address] [place], [place], [address] Phone: [phone]   Fax:  [phone]  Physical Therapy Treatment  Patient Details  Name: Sandra Stanton MRN: [id number] Date of Birth: Oct 05, 1960 Referring Provider (PT): Judi Saa, DO   Encounter Date: 06/23/2020   PT End of Session - 06/23/20 1737    Visit Number 5    Date for PT Re-Evaluation 07/22/20    Authorization Type 30 visit limit    PT Start Time 1532    PT Stop Time 1614    PT Time Calculation (min) 42 min    Activity Tolerance Patient tolerated treatment well           No past medical history on file.  Past Surgical History:  Procedure Laterality Date  . BREAST BIOPSY  2008  . CESAREAN SECTION  [id number]  . GALLBLADDER SURGERY  2007    There were no vitals filed for this visit.   Subjective Assessment - 06/23/20 1550    Subjective Got tennis elbow brace yesterday.  Wore today to work.  Pain wrist extensor musclesand lateral epicondyle.  Less pain with reaching    Patient Stated Goals be able to lift and reduce pain    Currently in Pain? Yes    Pain Score 0-No pain   with activity 2 or 3   Pain Location Elbow    Pain Orientation Right                             [facility] Adult PT Treatment/Exercise - 06/23/20 0001      Shoulder Exercises: Standing   Row Limitations 5# cuff weight rows at the counter 2x 10    Other Standing Exercises radial nerve gliding 10x    Other Standing Exercises yellow band on wrists C scoops on wall 10x      Shoulder Exercises: ROM/Strengthening   Other ROM/Strengthening Exercises yellow band around wrists steering wheels 10x      Wrist Exercises   Other wrist exercises red Flexbar 10x      Iontophoresis   Type of Iontophoresis Dexamethasone    Location right lateral epicondyle    Dose 4 mg/mL #4    Time 4 hour patch      Manual Therapy   Manual therapy comments lateral  glide mob with movement 10x    Soft tissue mobilization instrument assisted stainless steel instruments right extensor wad                    PT Short Term Goals - 05/27/20 1250      PT SHORT TERM GOAL #1   Title pt reports 25% decrease pain after typical work day    Time 4    Period Weeks    Status New    Target Date 06/24/20      PT SHORT TERM GOAL #2   Title ind with intial HEP and without increased pain    Time 4    Period Weeks    Status New    Target Date 06/24/20      PT SHORT TERM GOAL #3   Title Get grip strength measurement    Time 2    Period Weeks    Status New    Target Date 06/10/20      PT SHORT TERM GOAL #4   Title Improve grip strength to be at least 3lb more on  dominent Rt hand than on Lt hand    Time 6    Period Weeks    Status New    Target Date 07/08/20             PT Long Term Goals - 05/27/20 1252      PT LONG TERM GOAL #1   Title Improve posture and alignment with patient demonstrating correctly engaged posterior shoudler girdle    Time 8    Period Weeks    Status New    Target Date 07/22/20      PT LONG TERM GOAL #2   Title MMT wrist extension of at least 4+/5 no pain    Time 8    Period Weeks    Status New    Target Date 07/22/20      PT LONG TERM GOAL #3   Title Decrease pain Rt elbow by at least 75% with functional activities    Time 8    Period Weeks    Status New    Target Date 07/22/20      PT LONG TERM GOAL #4   Title Return to normal functional activities with independent advanced HEP    Time 8    Period Weeks    Status New    Target Date 07/22/20      PT LONG TERM GOAL #5   Title Improve FOTO to 63% functional up from 48%    Time 8    Period Weeks    Status New    Target Date 07/22/20                 Plan - 06/23/20 1738    Clinical Impression Statement The patient is in high symptom irritability phase with pain with home and work tasks.  Recommend regular use of her new tennis elbow  brace, palm up lifting and continued activity modification.  In the clinic she is able to perform light strengthening including eccentrics and shoulder/scapular strengthening.  She may need continued manual therapy, modalities and activity modification before progressing to a moderate irritability level.    Personal Factors and Comorbidities Time since onset of injury/illness/exacerbation;Profession    Examination-Participation Restrictions Community Activity;Occupation    Stability/Clinical Decision Making Evolving/Moderate complexity    Rehab Potential Excellent    PT Frequency 2x / week    PT Duration 8 weeks    PT Treatment/Interventions ADLs/Self Care Home Management;Cryotherapy;Electrical Stimulation;Moist Heat;Ultrasound;Therapeutic activities;Neuromuscular re-education;Therapeutic exercise;Patient/family education;Taping;Manual techniques;Dry needling;Iontophoresis 4mg /ml Dexamethasone    PT Next Visit Plan STM  to wrist extensors, ionto #5,progress strength as tolerated with eccentric wrist extension; pronation/supination;  and include shoulder and scapular muscles; check grip strength; check progress with STGs    PT Home Exercise Plan [id number]           Patient will benefit from skilled therapeutic intervention in order to improve the following deficits and impairments:  Pain,Impaired UE functional use,Increased fascial restricitons,Decreased strength,Increased muscle spasms  Visit Diagnosis: Pain in right elbow  Muscle weakness (generalized)     Problem List Patient Active Problem List   Diagnosis Date Noted  . Lateral epicondylitis of right elbow 12/26/2019  . Neck pain 12/26/2019  . Patellofemoral syndrome of left knee 06/26/2018  . Adhesive capsulitis of right shoulder 09/19/2017  . Chronic right shoulder pain 06/05/2017   06/07/2017, PT 06/23/20 5:45 PM Phone: [phone] Fax: [phone] 06/23/2020, 5:44 PM  [facility] [address]  [place], [place], [address] Phone: [phone]   Fax:  [phone]  Name: Sandra Stanton MRN: [id number] Date of Birth: 1961/05/05

## 2020-06-25 ENCOUNTER — Ambulatory Visit: Payer: 59 | Admitting: Physical Therapy

## 2020-06-25 ENCOUNTER — Other Ambulatory Visit: Payer: Self-pay

## 2020-06-25 DIAGNOSIS — M25521 Pain in right elbow: Secondary | ICD-10-CM | POA: Diagnosis not present

## 2020-06-25 DIAGNOSIS — M6281 Muscle weakness (generalized): Secondary | ICD-10-CM

## 2020-06-25 NOTE — Therapy (Signed)
[facility] [address] [place], [place], [address] Phone: [phone]   Fax:  [phone]  Physical Therapy Treatment  Patient Details  Name: Sandra Stanton MRN: [id number] Date of Birth: 31-Jul-1960 Referring Provider (PT): Judi Saa, DO   Encounter Date: 06/25/2020   PT End of Session - 06/25/20 1958    Visit Number 6    Date for PT Re-Evaluation 07/22/20    Authorization Type 30 visit limit    PT Start Time 1450    PT Stop Time 1530    PT Time Calculation (min) 40 min    Activity Tolerance Patient tolerated treatment well           No past medical history on file.  Past Surgical History:  Procedure Laterality Date  . BREAST BIOPSY  2008  . CESAREAN SECTION  [id number]  . GALLBLADDER SURGERY  2007    There were no vitals filed for this visit.   Subjective Assessment - 06/25/20 1953    Subjective Hasn't worn tennis elbow brace much since off work today.    Patient Stated Goals be able to lift and reduce pain    Currently in Pain? No/denies    Pain Score 0-No pain    Pain Location Elbow    Pain Orientation Right    Pain Type Chronic pain              [facility] PT Assessment - 06/25/20 0001      Strength   Overall Strength Comments left bent 35#, straight 42#; right bent 28#; straight 46#                         [facility] Adult PT Treatment/Exercise - 06/25/20 0001      Shoulder Exercises: Standing   Other Standing Exercises 2# cuff weight with 10x shoulder extension, horizontal abduction and 160 degree scaption    Other Standing Exercises yellow band on wrists C scoops on wall 10x      Shoulder Exercises: ROM/Strengthening   Other ROM/Strengthening Exercises light green loop with bil UE wall sldies lift offs      Wrist Exercises   Wrist Extension Strengthening;10 reps;Right    Bar Weights/Barbell (Wrist Extension) 2 lbs    Wrist Extension Limitations with self mob on  wall    Other wrist exercises at wall self mob with 2# pronation/supination 10x    Other wrist exercises red Flexbar 10x      Iontophoresis   Type of Iontophoresis Dexamethasone    Location right lateral epicondyle    Dose 4 mg/mL #5    Time 4 hour patch      Manual Therapy   Manual therapy comments lateral glide mob with movement 10x    Soft tissue mobilization instrument assisted stainless steel instruments right extensor wad                    PT Short Term Goals - 06/25/20 2003      PT SHORT TERM GOAL #1   Title pt reports 25% decrease pain after typical work day    Time 4    Period Weeks    Status On-going      PT SHORT TERM GOAL #2   Title ind with intial HEP and without increased pain    Status Achieved      PT SHORT TERM GOAL #3   Title Get grip strength measurement  Status Achieved      PT SHORT TERM GOAL #4   Title Improve grip strength to be at least 3lb more on dominent Rt hand than on Lt hand    Time 6    Period Weeks    Status On-going             PT Long Term Goals - 05/27/20 1252      PT LONG TERM GOAL #1   Title Improve posture and alignment with patient demonstrating correctly engaged posterior shoudler girdle    Time 8    Period Weeks    Status New    Target Date 07/22/20      PT LONG TERM GOAL #2   Title MMT wrist extension of at least 4+/5 no pain    Time 8    Period Weeks    Status New    Target Date 07/22/20      PT LONG TERM GOAL #3   Title Decrease pain Rt elbow by at least 75% with functional activities    Time 8    Period Weeks    Status New    Target Date 07/22/20      PT LONG TERM GOAL #4   Title Return to normal functional activities with independent advanced HEP    Time 8    Period Weeks    Status New    Target Date 07/22/20      PT LONG TERM GOAL #5   Title Improve FOTO to 63% functional up from 48%    Time 8    Period Weeks    Status New    Target Date 07/22/20                 Plan -  06/25/20 1959    Clinical Impression Statement The patient is able to perform moderated intensity right UE strengthening ex's without exacerbation of elbow pain.  She reports variable status with some good days/some bad days.    Improved soft tissue mobility of forearm extensor muscles.  Decreased grip strength on right vs. left particulary with bent elbow.    Rehab Potential Excellent    PT Frequency 2x / week    PT Duration 8 weeks    PT Treatment/Interventions ADLs/Self Care Home Management;Cryotherapy;Electrical Stimulation;Moist Heat;Ultrasound;Therapeutic activities;Neuromuscular re-education;Therapeutic exercise;Patient/family education;Taping;Manual techniques;Dry needling;Iontophoresis 4mg /ml Dexamethasone    PT Next Visit Plan STM  to wrist extensors, ionto #6 ,progress strength as tolerated with eccentric wrist extension; pronation/supination;  and include shoulder and scapular muscles; check % improvement    PT Home Exercise Plan [id number]           Patient will benefit from skilled therapeutic intervention in order to improve the following deficits and impairments:  Pain,Impaired UE functional use,Increased fascial restricitons,Decreased strength,Increased muscle spasms  Visit Diagnosis: Pain in right elbow  Muscle weakness (generalized)     Problem List Patient Active Problem List   Diagnosis Date Noted  . Lateral epicondylitis of right elbow 12/26/2019  . Neck pain 12/26/2019  . Patellofemoral syndrome of left knee 06/26/2018  . Adhesive capsulitis of right shoulder 09/19/2017  . Chronic right shoulder pain 06/05/2017   06/07/2017, PT 06/25/20 8:05 PM Phone: [phone] Fax: [phone] 06/25/2020, 8:04 PM  [facility] [address], [address] [place], [place], [place] Phone: [phone]   Fax:  [phone]  Name: Sandra Stanton MRN: [name] Date of Birth: [birth year]

## 2020-07-07 ENCOUNTER — Encounter: Payer: 59 | Admitting: Physical Therapy

## 2020-07-09 ENCOUNTER — Other Ambulatory Visit: Payer: Self-pay

## 2020-07-09 ENCOUNTER — Ambulatory Visit: Payer: 59 | Attending: Family Medicine | Admitting: Physical Therapy

## 2020-07-09 DIAGNOSIS — M25521 Pain in right elbow: Secondary | ICD-10-CM | POA: Diagnosis not present

## 2020-07-09 DIAGNOSIS — M6281 Muscle weakness (generalized): Secondary | ICD-10-CM | POA: Insufficient documentation

## 2020-07-09 NOTE — Therapy (Addendum)
[facility] [address] [place], [place], [address] Phone: [phone]   Fax:  [phone]  Physical Therapy Treatment/Discharge Summary   Patient Details  Name: Sandra Stanton MRN: [id number] Date of Birth: [birth year] Referring Provider (PT): Lyndal Pulley, DO   Encounter Date: 07/09/2020   PT End of Session - 07/09/20 1731    Visit Number 7    Date for PT Re-Evaluation 07/22/20    Authorization Type 30 visit limit    PT Start Time 1400    PT Stop Time 1444    PT Time Calculation (min) 44 min    Activity Tolerance Patient tolerated treatment well           No past medical history on file.  Past Surgical History:  Procedure Laterality Date  . BREAST BIOPSY  2008  . CESAREAN SECTION  [id number]  . GALLBLADDER SURGERY  2007    There were no vitals filed for this visit.   Subjective Assessment - 07/09/20 1405    Subjective It's my first day off in 8 days.  If I don't do anything my elbow is fine but with any activity it gets aggravated.  It's up and down.  I wear the tennis brace when I need it.    Patient Stated Goals be able to lift and reduce pain    Currently in Pain? No/denies    Pain Score 0-No pain    Pain Location Elbow    Pain Orientation Right                             [facility] Adult PT Treatment/Exercise - 07/09/20 0001      Shoulder Exercises: ROM/Strengthening   Other ROM/Strengthening Exercises light green loop with bil UE wall sldies lift offs    Other ROM/Strengthening Exercises light green loop clocks 10x      Wrist Exercises   Wrist Extension Strengthening;Right;20 reps    Bar Weights/Barbell (Wrist Extension) 2 lbs    Wrist Extension Limitations eccentrically and also with self mob at the wall    Other wrist exercises at wall self mob with gripping 10x    Other wrist exercises red Flexbar 10x2      Iontophoresis   Type of Iontophoresis Dexamethasone     Location right lateral epicondyle    Dose 4 mg/mL #6    Time 4 hour patch      Manual Therapy   Manual therapy comments lateral glide mob with movement 10x    Soft tissue mobilization instrument assisted stainless steel instruments right extensor wad                    PT Short Term Goals - 06/25/20 2003      PT SHORT TERM GOAL #1   Title pt reports 25% decrease pain after typical work day    Time 4    Period Weeks    Status On-going      PT SHORT TERM GOAL #2   Title ind with intial HEP and without increased pain    Status Achieved      PT SHORT TERM GOAL #3   Title Get grip strength measurement    Status Achieved      PT SHORT TERM GOAL #4   Title Improve grip strength to be at least 3lb more on dominent Rt hand than on Lt hand    Time 6  Period Weeks    Status On-going             PT Long Term Goals - 05/27/20 1252      PT LONG TERM GOAL #1   Title Improve posture and alignment with patient demonstrating correctly engaged posterior shoudler girdle    Time 8    Period Weeks    Status New    Target Date 07/22/20      PT LONG TERM GOAL #2   Title MMT wrist extension of at least 4+/5 no pain    Time 8    Period Weeks    Status New    Target Date 07/22/20      PT LONG TERM GOAL #3   Title Decrease pain Rt elbow by at least 75% with functional activities    Time 8    Period Weeks    Status New    Target Date 07/22/20      PT LONG TERM GOAL #4   Title Return to normal functional activities with independent advanced HEP    Time 8    Period Weeks    Status New    Target Date 07/22/20      PT LONG TERM GOAL #5   Title Improve FOTO to 63% functional up from 48%    Time 8    Period Weeks    Status New    Target Date 07/22/20                 Plan - 07/09/20 1731    Clinical Impression Statement The patient reports up/down response particularly as 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  Discussed physiology of  healing and time requirement with  eccentric strengthening for collagen remodeling.  She denies pain with eccentrics, self mobilizations and  upper quarter strengthening.  Ionto series complete.    Personal Factors and Comorbidities Time since onset of injury/illness/exacerbation;Profession    Rehab Potential Excellent    PT Frequency 2x / week    PT Duration 8 weeks    PT Treatment/Interventions ADLs/Self Care Home Management;Cryotherapy;Electrical Stimulation;Moist Heat;Ultrasound;Therapeutic activities;Neuromuscular re-education;Therapeutic exercise;Patient/family education;Taping;Manual techniques;Dry needling;Iontophoresis 72m/ml Dexamethasone    PT Next Visit Plan STM  to wrist extensors, ionto series complete ,progress strength as tolerated with eccentric wrist extension; pronation/supination;  and include shoulder and scapular muscles    PT Home Exercise Plan [id number]           Patient will benefit from skilled therapeutic intervention in order to improve the following deficits and impairments:  Pain,Impaired UE functional use,Increased fascial restricitons,Decreased strength,Increased muscle spasms  Visit Diagnosis: Pain in right elbow  Muscle weakness (generalized)    PHYSICAL THERAPY DISCHARGE SUMMARY  Visits from Start of Care: 7 Current functional level related to goals / functional outcomes: The patient called to cancel her appts and request discharge, she will plans to have surgery.    Remaining deficits: As above   Education / Equipment:HEP  Plan: Patient agrees to discharge.  Patient goals were partially met. Patient is being discharged due to the patient's request.  ?????          Problem List Patient Active Problem List   Diagnosis Date Noted  . Lateral epicondylitis of right elbow 12/26/2019  . Neck pain 12/26/2019  . Patellofemoral syndrome of left knee 06/26/2018  . Adhesive capsulitis of right shoulder 09/19/2017  . Chronic right shoulder pain  06/05/2017   SRuben Im PT 07/09/20 5:35 PM Phone:  [phone] Fax: [phone] Alvera Singh 07/09/2020, 5:35 PM  [facility] [address], [place], [place], [address] Phone: [phone]   Fax:  [phone]  Name: ANSLIE SPADAFORA MRN: [id number] Date of Birth: [birth year]

## 2020-07-13 ENCOUNTER — Ambulatory Visit (INDEPENDENT_AMBULATORY_CARE_PROVIDER_SITE_OTHER): Payer: 59 | Admitting: Family Medicine

## 2020-07-13 ENCOUNTER — Other Ambulatory Visit: Payer: Self-pay

## 2020-07-13 ENCOUNTER — Ambulatory Visit: Payer: Self-pay

## 2020-07-13 ENCOUNTER — Encounter: Payer: Self-pay | Admitting: Family Medicine

## 2020-07-13 VITALS — BP 114/80 | HR 83 | Ht 66.0 in | Wt 154.0 lb

## 2020-07-13 DIAGNOSIS — M7711 Lateral epicondylitis, right elbow: Secondary | ICD-10-CM | POA: Diagnosis not present

## 2020-07-13 DIAGNOSIS — M25521 Pain in right elbow: Secondary | ICD-10-CM

## 2020-07-13 NOTE — Assessment & Plan Note (Signed)
Patient does have a low-grade tear and does not seem to be healing.  Patient has failed PRP, injections, home exercises, formal physical therapy.  We will send patient to orthopedic surgery to discuss surgical intervention for this.  Patient's ultrasound does show some very mild improvement but nothing severe.

## 2020-07-13 NOTE — Patient Instructions (Signed)
[facility] We are here if you have questions

## 2020-07-13 NOTE — Progress Notes (Signed)
[name] [address] [place] [address] Phone: [phone] Subjective:   Sandra Stanton, am serving as a scribe for Dr. Antoine Primas. This visit occurred during the SARS-CoV-2 public health emergency.  Safety protocols were in place, including screening questions prior to the visit, additional usage of staff PPE, and extensive cleaning of exam room while observing appropriate contact time as indicated for disinfecting solutions.   I'm seeing this patient by the request  of:  Scifres, Nicole Cella, PA-C  CC: Right elbow pain follow-up  GBT:[id number]  Sandra Stanton is a 60 y.o. female coming in with complaint of right elbow pain. Patient has been doing physical therapy. Patient states that she continues to have pain with ADLs. Patient is frustrated she is not making progress. Is doing PT 1-2x a week.  Patient has not noticed any significant improvement at this time.       No past medical history on file. Past Surgical History:  Procedure Laterality Date  . BREAST BIOPSY  2008  . CESAREAN SECTION  [id number]  . GALLBLADDER SURGERY  2007   Social History   Socioeconomic History  . Marital status: Married    Spouse name: Not on file  . Number of children: Not on file  . Years of education: Not on file  . Highest education level: Not on file  Occupational History  . Not on file  Tobacco Use  . Smoking status: Former Games developer  . Smokeless tobacco: Never Used  Substance and Sexual Activity  . Alcohol use: Not on file  . Drug use: Not on file  . Sexual activity: Not on file  Other Topics Concern  . Not on file  Social History Narrative  . Not on file   Social Determinants of Health   Financial Resource Strain: Not on file  Food Insecurity: Not on file  Transportation Needs: Not on file  Physical Activity: Not on file  Stress: Not on file  Social Connections: Not on file   Not on File No family history on  file.       Current Outpatient Medications (Other):  .  gabapentin (NEURONTIN) 100 MG capsule, TAKE 2 CAPSULES(200 MG) BY MOUTH AT BEDTIME .  Vitamin D, Ergocalciferol, (DRISDOL) 50000 units CAPS capsule, Take 1 capsule (50,000 Units total) by mouth every 7 (seven) days.   Reviewed prior external information including notes and imaging from  primary care provider As well as notes that were available from care everywhere and other healthcare systems.  Past medical history, social, surgical and family history all reviewed in electronic medical record.  No pertanent information unless stated regarding to the chief complaint.   Review of Systems:  No headache, visual changes, nausea, vomiting, diarrhea, constipation, dizziness, abdominal pain, skin rash, fevers, chills, night sweats, weight loss, swollen lymph nodes, body aches, joint swelling, chest pain, shortness of breath, mood changes. POSITIVE muscle aches  Objective  Blood pressure 114/80, pulse 83, height 5\' 6"  (1.676 m), weight 154 lb (69.9 kg), SpO2 98 %.   General: No apparent distress alert and oriented x3 mood and affect normal, dressed appropriately.  HEENT: Pupils equal, extraocular movements intact  Respiratory: Patient's speak in full sentences and does not appear short of breath  Cardiovascular: No lower extremity edema, non tender, no erythema  Gait normal with good balance and coordination.  MSK: Right elbow still has tenderness to palpation over the lateral epicondylar region.  He does have some mild weakness noted  with extension of the wrist against resistance.  Grip strength is fair but does have pain.  Limited musculoskeletal ultrasound was performed and interpreted by Judi Saa  Limited ultrasound of patient's right elbow in the lateral epicondylar region shows the patient does still have intersubstance tear noted with neovascularization.  No significant hypoechoic changes surrounding the nerve  though. Impression: No interval change noted of the common extensor tendon tear    Impression and Recommendations:     The above documentation has been reviewed and is accurate and complete Judi Saa, DO

## 2020-07-14 ENCOUNTER — Encounter: Payer: 59 | Admitting: Physical Therapy

## 2020-07-16 ENCOUNTER — Encounter: Payer: 59 | Admitting: Physical Therapy

## 2020-08-05 ENCOUNTER — Other Ambulatory Visit: Payer: Self-pay | Admitting: Orthopedic Surgery

## 2020-08-07 ENCOUNTER — Other Ambulatory Visit: Payer: Self-pay

## 2020-08-07 ENCOUNTER — Encounter (HOSPITAL_BASED_OUTPATIENT_CLINIC_OR_DEPARTMENT_OTHER): Payer: Self-pay | Admitting: Orthopedic Surgery

## 2020-08-13 ENCOUNTER — Other Ambulatory Visit (HOSPITAL_COMMUNITY): Payer: 59

## 2020-08-14 ENCOUNTER — Other Ambulatory Visit (HOSPITAL_COMMUNITY)
Admission: RE | Admit: 2020-08-14 | Discharge: 2020-08-14 | Disposition: A | Discharge status: Home / Self Care | Payer: 59 | Source: Ambulatory Visit | Attending: Orthopedic Surgery | Admitting: Orthopedic Surgery

## 2020-08-14 DIAGNOSIS — Z01812 Encounter for preprocedural laboratory examination: Secondary | ICD-10-CM | POA: Diagnosis present

## 2020-08-14 DIAGNOSIS — Z20822 Contact with and (suspected) exposure to covid-19: Secondary | ICD-10-CM | POA: Insufficient documentation

## 2020-08-14 NOTE — Progress Notes (Signed)

## 2020-08-15 LAB — SARS CORONAVIRUS 2 (TAT 6-24 HRS): SARS Coronavirus 2: NEGATIVE

## 2020-08-17 ENCOUNTER — Other Ambulatory Visit: Payer: Self-pay

## 2020-08-17 ENCOUNTER — Encounter (HOSPITAL_BASED_OUTPATIENT_CLINIC_OR_DEPARTMENT_OTHER)
Admission: RE | Disposition: A | Discharge status: Home / Self Care | Payer: Self-pay | Source: Home / Self Care | Attending: Orthopedic Surgery

## 2020-08-17 ENCOUNTER — Ambulatory Visit (HOSPITAL_BASED_OUTPATIENT_CLINIC_OR_DEPARTMENT_OTHER)
Admission: RE | Admit: 2020-08-17 | Discharge: 2020-08-17 | Disposition: A | Discharge status: Home / Self Care | Payer: 59 | Attending: Orthopedic Surgery | Admitting: Orthopedic Surgery

## 2020-08-17 ENCOUNTER — Ambulatory Visit (HOSPITAL_BASED_OUTPATIENT_CLINIC_OR_DEPARTMENT_OTHER): Payer: 59 | Admitting: Certified Registered"

## 2020-08-17 ENCOUNTER — Encounter (HOSPITAL_BASED_OUTPATIENT_CLINIC_OR_DEPARTMENT_OTHER): Payer: Self-pay | Admitting: Orthopedic Surgery

## 2020-08-17 DIAGNOSIS — Z885 Allergy status to narcotic agent status: Secondary | ICD-10-CM | POA: Diagnosis not present

## 2020-08-17 DIAGNOSIS — Z87891 Personal history of nicotine dependence: Secondary | ICD-10-CM | POA: Diagnosis not present

## 2020-08-17 DIAGNOSIS — Z79890 Hormone replacement therapy (postmenopausal): Secondary | ICD-10-CM | POA: Diagnosis not present

## 2020-08-17 DIAGNOSIS — Z880 Allergy status to penicillin: Secondary | ICD-10-CM | POA: Diagnosis not present

## 2020-08-17 DIAGNOSIS — M7711 Lateral epicondylitis, right elbow: Secondary | ICD-10-CM | POA: Insufficient documentation

## 2020-08-17 DIAGNOSIS — Z79899 Other long term (current) drug therapy: Secondary | ICD-10-CM | POA: Insufficient documentation

## 2020-08-17 HISTORY — DX: Hypothyroidism, unspecified: E03.9

## 2020-08-17 HISTORY — DX: Anxiety disorder, unspecified: F41.9

## 2020-08-17 HISTORY — DX: Nausea with vomiting, unspecified: R11.2

## 2020-08-17 HISTORY — DX: Other specified postprocedural states: Z98.890

## 2020-08-17 HISTORY — DX: Depression, unspecified: F32.A

## 2020-08-17 HISTORY — PX: TENNIS ELBOW RELEASE/NIRSCHEL PROCEDURE: SHX6651

## 2020-08-17 SURGERY — TENNIS ELBOW RELEASE/NIRSCHEL PROCEDURE
Anesthesia: Monitor Anesthesia Care | Site: Elbow | Laterality: Right

## 2020-08-17 MED ORDER — MIDAZOLAM HCL 2 MG/2ML IJ SOLN
INTRAMUSCULAR | Status: AC
  Filled 2020-08-17: qty 2

## 2020-08-17 MED ORDER — EPHEDRINE SULFATE 50 MG/ML IJ SOLN
INTRAMUSCULAR | Status: DC | PRN
  Administered 2020-08-17: 15 mg via INTRAVENOUS

## 2020-08-17 MED ORDER — LACTATED RINGERS IV SOLN: INTRAVENOUS | Status: DC

## 2020-08-17 MED ORDER — PROMETHAZINE HCL 25 MG/ML IJ SOLN: 6.2500 mg | INTRAMUSCULAR | Status: DC | PRN

## 2020-08-17 MED ORDER — ROPIVACAINE HCL 5 MG/ML IJ SOLN
INTRAMUSCULAR | Status: DC | PRN
  Administered 2020-08-17: 30 mL via PERINEURAL

## 2020-08-17 MED ORDER — ONDANSETRON HCL 4 MG/2ML IJ SOLN
INTRAMUSCULAR | Status: DC | PRN
  Administered 2020-08-17: 4 mg via INTRAVENOUS

## 2020-08-17 MED ORDER — OXYCODONE HCL 5 MG PO TABS: 5.0000 mg | ORAL_TABLET | Freq: Once | ORAL | Status: DC | PRN

## 2020-08-17 MED ORDER — FENTANYL CITRATE (PF) 100 MCG/2ML IJ SOLN
50.0000 ug | Freq: Once | INTRAMUSCULAR | Status: AC
  Administered 2020-08-17: 50 ug via INTRAVENOUS

## 2020-08-17 MED ORDER — PROPOFOL 500 MG/50ML IV EMUL
INTRAVENOUS | Status: DC | PRN
  Administered 2020-08-17: 100 ug/kg/min via INTRAVENOUS

## 2020-08-17 MED ORDER — HYDROMORPHONE HCL 1 MG/ML IJ SOLN: 0.2500 mg | INTRAMUSCULAR | Status: DC | PRN

## 2020-08-17 MED ORDER — BUPIVACAINE HCL (PF) 0.25 % IJ SOLN
INTRAMUSCULAR | Status: AC
  Filled 2020-08-17: qty 30

## 2020-08-17 MED ORDER — VANCOMYCIN HCL IN DEXTROSE 1-5 GM/200ML-% IV SOLN
INTRAVENOUS | Status: AC
  Filled 2020-08-17: qty 200

## 2020-08-17 MED ORDER — DEXAMETHASONE SODIUM PHOSPHATE 10 MG/ML IJ SOLN
INTRAMUSCULAR | Status: AC
  Filled 2020-08-17: qty 1

## 2020-08-17 MED ORDER — FENTANYL CITRATE (PF) 100 MCG/2ML IJ SOLN
INTRAMUSCULAR | Status: AC
  Filled 2020-08-17: qty 2

## 2020-08-17 MED ORDER — HYDROCODONE-ACETAMINOPHEN 5-325 MG PO TABS: 1.0000 | ORAL_TABLET | ORAL | 0 refills | Status: AC | PRN

## 2020-08-17 MED ORDER — LIDOCAINE HCL 2 % IJ SOLN
INTRAMUSCULAR | Status: AC
  Filled 2020-08-17: qty 20

## 2020-08-17 MED ORDER — OXYCODONE HCL 5 MG/5ML PO SOLN: 5.0000 mg | Freq: Once | ORAL | Status: DC | PRN

## 2020-08-17 MED ORDER — ONDANSETRON HCL 4 MG/2ML IJ SOLN
INTRAMUSCULAR | Status: AC
  Filled 2020-08-17: qty 2

## 2020-08-17 MED ORDER — LIDOCAINE 2% (20 MG/ML) 5 ML SYRINGE
INTRAMUSCULAR | Status: AC
  Filled 2020-08-17: qty 5

## 2020-08-17 MED ORDER — MIDAZOLAM HCL 2 MG/2ML IJ SOLN
1.0000 mg | Freq: Once | INTRAMUSCULAR | Status: AC
  Administered 2020-08-17: 2 mg via INTRAVENOUS

## 2020-08-17 MED ORDER — PROPOFOL 500 MG/50ML IV EMUL
INTRAVENOUS | Status: AC
  Filled 2020-08-17: qty 50

## 2020-08-17 MED ORDER — VANCOMYCIN HCL IN DEXTROSE 1-5 GM/200ML-% IV SOLN
1000.0000 mg | INTRAVENOUS | Status: AC
  Administered 2020-08-17: 1000 mg via INTRAVENOUS

## 2020-08-17 MED ORDER — TIZANIDINE HCL 4 MG PO TABS: 4.0000 mg | ORAL_TABLET | Freq: Three times a day (TID) | ORAL | 1 refills | Status: DC | PRN

## 2020-08-17 SURGICAL SUPPLY — 51 items
APL PRP STRL LF DISP 70% ISPRP (MISCELLANEOUS) ×1
BLADE SURG 15 STRL LF DISP TIS (BLADE) ×1 IMPLANT
BLADE SURG 15 STRL SS (BLADE) ×2
BNDG CMPR 9X4 STRL LF SNTH (GAUZE/BANDAGES/DRESSINGS) ×1
BNDG COHESIVE 4X5 TAN STRL (GAUZE/BANDAGES/DRESSINGS) ×2 IMPLANT
BNDG ELASTIC 3X5.8 VLCR STR LF (GAUZE/BANDAGES/DRESSINGS) IMPLANT
BNDG ELASTIC 4X5.8 VLCR STR LF (GAUZE/BANDAGES/DRESSINGS) ×4 IMPLANT
BNDG ESMARK 4X9 LF (GAUZE/BANDAGES/DRESSINGS) ×2 IMPLANT
CANISTER SUCT 1200ML W/VALVE (MISCELLANEOUS) IMPLANT
CHLORAPREP W/TINT 26 (MISCELLANEOUS) ×2 IMPLANT
CORD BIPOLAR FORCEPS 12FT (ELECTRODE) IMPLANT
COVER BACK TABLE 60X90IN (DRAPES) ×2 IMPLANT
COVER WAND RF STERILE (DRAPES) IMPLANT
CUFF TOURN SGL QUICK 18X3 (MISCELLANEOUS) ×2 IMPLANT
DECANTER SPIKE VIAL GLASS SM (MISCELLANEOUS) IMPLANT
DRAPE EXTREMITY T 121X128X90 (DISPOSABLE) ×2 IMPLANT
DRAPE IMP U-DRAPE 54X76 (DRAPES) ×2 IMPLANT
DRAPE INCISE IOBAN 66X45 STRL (DRAPES) IMPLANT
DRAPE SURG 17X23 STRL (DRAPES) ×2 IMPLANT
ELECT REM PT RETURN 9FT ADLT (ELECTROSURGICAL) ×2
ELECTRODE REM PT RTRN 9FT ADLT (ELECTROSURGICAL) ×1 IMPLANT
GAUZE SPONGE 4X4 12PLY STRL (GAUZE/BANDAGES/DRESSINGS) ×2 IMPLANT
GLOVE SRG 8 PF TXTR STRL LF DI (GLOVE) ×1 IMPLANT
GLOVE SURG ENC MOIS LTX SZ7 (GLOVE) ×2 IMPLANT
GLOVE SURG ENC MOIS LTX SZ7.5 (GLOVE) ×2 IMPLANT
GLOVE SURG UNDER POLY LF SZ7 (GLOVE) ×2 IMPLANT
GLOVE SURG UNDER POLY LF SZ8 (GLOVE) ×2
GOWN STRL REUS W/ TWL LRG LVL3 (GOWN DISPOSABLE) ×2 IMPLANT
GOWN STRL REUS W/ TWL XL LVL3 (GOWN DISPOSABLE) ×1 IMPLANT
GOWN STRL REUS W/TWL LRG LVL3 (GOWN DISPOSABLE) ×4
GOWN STRL REUS W/TWL XL LVL3 (GOWN DISPOSABLE) ×2
NEEDLE HYPO 25X1 1.5 SAFETY (NEEDLE) IMPLANT
NS IRRIG 1000ML POUR BTL (IV SOLUTION) ×2 IMPLANT
PACK BASIN DAY SURGERY FS (CUSTOM PROCEDURE TRAY) ×2 IMPLANT
PADDING CAST ABS 4INX4YD NS (CAST SUPPLIES) ×1
PADDING CAST ABS COTTON 4X4 ST (CAST SUPPLIES) ×1 IMPLANT
PENCIL SMOKE EVACUATOR (MISCELLANEOUS) ×2 IMPLANT
STOCKINETTE TUBULAR COTT 4X25 (GAUZE/BANDAGES/DRESSINGS) ×2 IMPLANT
STRIP CLOSURE SKIN 1/2X4 (GAUZE/BANDAGES/DRESSINGS) IMPLANT
SUCTION FRAZIER HANDLE 10FR (MISCELLANEOUS)
SUCTION TUBE FRAZIER 10FR DISP (MISCELLANEOUS) IMPLANT
SUT MNCRL AB 4-0 PS2 18 (SUTURE) IMPLANT
SUT VIC AB 0 CT1 27 (SUTURE)
SUT VIC AB 0 CT1 27XBRD ANBCTR (SUTURE) IMPLANT
SUT VIC AB 2-0 SH 27 (SUTURE)
SUT VIC AB 2-0 SH 27XBRD (SUTURE) IMPLANT
SYR BULB EAR ULCER 3OZ GRN STR (SYRINGE) ×2 IMPLANT
SYR CONTROL 10ML LL (SYRINGE) IMPLANT
TOWEL GREEN STERILE FF (TOWEL DISPOSABLE) ×2 IMPLANT
TUBE CONNECTING 20X1/4 (TUBING) IMPLANT
UNDERPAD 30X36 HEAVY ABSORB (UNDERPADS AND DIAPERS) ×2 IMPLANT

## 2020-08-17 NOTE — Anesthesia Procedure Notes (Signed)
Anesthesia Regional Block: Supraclavicular block   Pre-Anesthetic Checklist: ,, timeout performed, Correct Patient, Correct Site, Correct Laterality, Correct Procedure, Correct Position, site marked, Risks and benefits discussed,  Surgical consent,  Pre-op evaluation,  At surgeon's request and post-op pain management  Laterality: Right  Prep: chloraprep       Needles:  Injection technique: Single-shot  Needle Type: Stimiplex     Needle Length: 9cm  Needle Gauge: 21     Additional Needles:   Procedures:,,,, ultrasound used (permanent image in chart),,,,  Narrative:  Start time: 08/17/2020 12:22 PM End time: 08/17/2020 12:27 PM Injection made incrementally with aspirations every 5 mL.  Performed by: Personally  Anesthesiologist: Lowella Curb, MD

## 2020-08-17 NOTE — Anesthesia Postprocedure Evaluation (Signed)
Anesthesia Post Note  Patient: Sandra Stanton  Procedure(s) Performed: RIGHT ELBOW LATERAL DEBRIDEMENT AND TENDON REPAIR (Right Elbow)     Patient location during evaluation: PACU Anesthesia Type: Regional Level of consciousness: awake and alert Pain management: pain level controlled Vital Signs Assessment: post-procedure vital signs reviewed and stable Respiratory status: spontaneous breathing, nonlabored ventilation and respiratory function stable Cardiovascular status: blood pressure returned to baseline and stable Postop Assessment: no apparent nausea or vomiting Anesthetic complications: no   No complications documented.  Last Vitals:  Vitals:   08/17/20 1445 08/17/20 1500  BP: (!) 92/57 (!) 99/56  Pulse: 72 70  Resp: 17 16  Temp:    SpO2: 99% 96%    Last Pain:  Vitals:   08/17/20 1500  TempSrc:   PainSc: 0-No pain                 Lowella Curb

## 2020-08-17 NOTE — Op Note (Signed)
Procedure(s): RIGHT ELBOW LATERAL DEBRIDEMENT AND TENDON REPAIR Procedure Note  Sandra Stanton female 60 y.o. 08/17/2020   Preoperative diagnosis: Right elbow lateral epicondylitis with involvement of the common extensor origin  Postoperative diagnosis: Same  Procedure(s) and Anesthesia Type:    * RIGHT ELBOW LATERAL DEBRIDEMENT AND TENDON REPAIR - Monitor Anesthesia Care      Surgeon: Berline Lopes   Assistants: Damita Lack PA-C [facility] was present and scrubbed throughout the procedure and was essential in positioning, retraction, exposure, and closure)  Anesthesia: IV regional    Procedure Detail  RIGHT ELBOW LATERAL DEBRIDEMENT AND TENDON REPAIR  Estimated Blood Loss:  Minimal         Drains: none  Blood Given: none         Specimens: none        Complications:  * No complications entered in OR log *         Disposition: PACU - hemodynamically stable.         Condition: stable    Procedure:   INDICATIONS FOR SURGERY: Chronic right lateral epicondylitis which is failed extensive conservative management.  MRI showed partial involvement of the extensor origin.Marland Kitchen  OPERATIVE FINDINGS: Findings typical of chronic lateral epicondylitis with degeneration of the ECRB origin.  This poor tissue was excised and the tendon was repaired.  DESCRIPTION OF PROCEDURE: The patient was identified in preoperative  holding area where I personally marked the operative site after  verifying site, side, and procedure with the patient.  She got a regional block by the attending anesthesiologist.  The patient was taken back  to the operating room where general anesthesia was induced without  Complication. The patient did receive preoperative IV antibiotics. The patient was kept in the supine position. The operative extremity was prepped and draped in standard sterile fashion. A. approximately 3 cm transverse incision was made over the lateral epicondyle.  Dissection was  carried down to the fascia.  A longitudinal split was made just behind the junction of the ECRB and ECRL.  The origin of the ECRB was noted to be gray and amorphous typical of chronic lateral epicondylitis with some involvement of the extensor origin.  The amorphous degenerated tissue was sharply excised.  The bony origin of the ECRB was debrided down to a bleeding surface with a rondure.  The overlying tendon was then repaired using 0 Vicryl suture.  Copious irrigation was used.  The wound was then closed with 3-0 Vicryl in a deep dermal layer and Steri-Strips on the skin.  A light sterile dressing was then applied.  The tourniquet was let down. The patient was allowed to awaken from anesthesia transferred to the stretcher and taken to the recovery room in stable condition.  Postoperative plan: She will be discharged home today with her family and will follow-up in about 10 days.  She will use her wrist brace.

## 2020-08-17 NOTE — Anesthesia Preprocedure Evaluation (Signed)
Anesthesia Evaluation  Patient identified by MRN, date of birth, ID band Patient awake    Reviewed: Allergy & Precautions, NPO status , Patient's Chart, lab work & pertinent test results  History of Anesthesia Complications (+) PONV  Airway Mallampati: II  TM Distance: >3 FB Neck ROM: Full    Dental no notable dental hx.    Pulmonary neg pulmonary ROS, former smoker,    Pulmonary exam normal breath sounds clear to auscultation       Cardiovascular negative cardio ROS Normal cardiovascular exam Rhythm:Regular Rate:Normal     Neuro/Psych Anxiety Depression negative neurological ROS  negative psych ROS   GI/Hepatic negative GI ROS, Neg liver ROS,   Endo/Other  Hypothyroidism   Renal/GU negative Renal ROS  negative genitourinary   Musculoskeletal  (+) Arthritis , Osteoarthritis,    Abdominal   Peds negative pediatric ROS (+)  Hematology negative hematology ROS (+)   Anesthesia Other Findings   Reproductive/Obstetrics negative OB ROS                             Anesthesia Physical Anesthesia Plan  ASA: II  Anesthesia Plan: MAC and Regional   Post-op Pain Management:    Induction: Intravenous  PONV Risk Score and Plan: 3 and Ondansetron, Dexamethasone, Midazolam and Treatment may vary due to age or medical condition  Airway Management Planned: Simple Face Mask  Additional Equipment:   Intra-op Plan:   Post-operative Plan:   Informed Consent: I have reviewed the patients History and Physical, chart, labs and discussed the procedure including the risks, benefits and alternatives for the proposed anesthesia with the patient or authorized representative who has indicated his/her understanding and acceptance.     Dental advisory given  Plan Discussed with: CRNA  Anesthesia Plan Comments:         Anesthesia Quick Evaluation

## 2020-08-17 NOTE — Progress Notes (Signed)
Assisted Dr. Miller with right, ultrasound guided, supraclavicular block. Side rails up, monitors on throughout procedure. See vital signs in flow sheet. Tolerated Procedure well. 

## 2020-08-17 NOTE — Transfer of Care (Signed)
Immediate Anesthesia Transfer of Care Note  Patient: Sandra Stanton  Procedure(s) Performed: RIGHT ELBOW LATERAL DEBRIDEMENT AND TENDON REPAIR (Right Elbow)  Patient Location: PACU  Anesthesia Type:MAC and Regional  Level of Consciousness: awake, alert  and oriented  Airway & Oxygen Therapy: Patient Spontanous Breathing and Patient connected to face mask oxygen  Post-op Assessment: Report given to RN and Post -op Vital signs reviewed and stable  Post vital signs: Reviewed and stable  Last Vitals:  Vitals Value Taken Time  BP    Temp    Pulse 71 08/17/20 1442  Resp 17 08/17/20 1442  SpO2 99 % 08/17/20 1442  Vitals shown include unvalidated device data.  Last Pain:  Vitals:   08/17/20 1154  TempSrc: Oral  PainSc: 2       Patients Stated Pain Goal: 6 (45/62/56 3893)  Complications: No complications documented.

## 2020-08-17 NOTE — H&P (Signed)
Sandra Stanton is an 60 y.o. female.   Chief Complaint: R elbow pain HPI: R chronic lateral epicondylitis with partial extensor tearing, failed conservative treatment.  Past Medical History:  Diagnosis Date  . Anxiety   . Depression   . Hypothyroidism   . PONV (postoperative nausea and vomiting)     Past Surgical History:  Procedure Laterality Date  . ABDOMINAL HYSTERECTOMY    . BREAST BIOPSY  2008  . CESAREAN SECTION  [id number]  . GALLBLADDER SURGERY  2007    History reviewed. No pertinent family history. Social History:  reports that she has quit smoking. She has never used smokeless tobacco. She reports current alcohol use. She reports that she does not use drugs.  Allergies:  Allergies  Allergen Reactions  . Codeine Nausea And Vomiting  . Morphine And Related Nausea And Vomiting  . Penicillins Hives    Medications Prior to Admission  Medication Sig Dispense Refill  . ARMOUR THYROID PO Take by mouth.    Marland Kitchen buPROPion (WELLBUTRIN) 100 MG tablet Take 100 mg by mouth 2 (two) times daily.    . calcium carbonate (OS-CAL - DOSED IN MG OF ELEMENTAL CALCIUM) 1250 (500 Ca) MG tablet Take 1 tablet by mouth.    . escitalopram (LEXAPRO) 10 MG tablet Take 10 mg by mouth daily.    . Omega-3 Fatty Acids (FISH OIL) 1000 MG CAPS Take by mouth.    . vitamin k 100 MCG tablet Take 100 mcg by mouth daily.      No results found for this or any previous visit (from the past 48 hour(s)). No results found.  Review of Systems  All other systems reviewed and are negative.   Blood pressure 101/60, pulse 66, temperature 97.7 F (36.5 C), temperature source Oral, resp. rate 11, height 5\' 6"  (1.676 m), weight 69.7 kg, SpO2 100 %. Physical Exam Constitutional:      Appearance: Normal appearance.  HENT:     Head: Atraumatic.  Eyes:     Extraocular Movements: Extraocular movements intact.  Cardiovascular:     Pulses: Normal pulses.  Pulmonary:     Effort: Pulmonary effort is normal.   Musculoskeletal:     Comments: R elbow TTP over lateral epicondyle  Skin:    General: Skin is warm.  Neurological:     Mental Status: She is alert.      Assessment/Plan R chronic lateral epicondylitis with partial extensor tearing, failed conservative treatment. Plan R lateral epicondylar debridement with extensor repair Risks / benefits of surgery discussed Consent on chart  NPO for OR Preop antibiotics   , MD 08/17/2020, 12:38 PM

## 2020-08-17 NOTE — Discharge Instructions (Signed)
Discharge Instructions after Open Elbow Surgery   A sling may be provided for you. Wear the sling until your block wears off. Then discontinue Wear the WRIST IMMOBILIZER at all times unless bathing and dressing. Use ice on the elbow intermittently over the first 48 hours after surgery. Pain medicine has been prescribed for you.  Use your medicine liberally over the first 48 hours, and then you can begin to taper your use. You may take Extra Strength Tylenol or Tylenol only in place of the pain pills.  Remove the dressing after 5 days, okay to shower then. Leave the steri strip on until your first appt with Dr. Ave Filter Take one aspirin a day for 2 weeks after surgery, unless you have an aspirin sensitivity/ allergy or asthma.    Please call [phone] during normal business hours or [phone] after hours for any problems. Including the following:  - excessive redness of the incisions - drainage for more than 4 days - fever of more than 101.5 F  *Please note that pain medications will not be refilled after hours or on weekends.    Post Anesthesia Home Care Instructions  Activity: Get plenty of rest for the remainder of the day. A responsible individual must stay with you for 24 hours following the procedure.  For the next 24 hours, DO NOT: -Drive a car -Advertising copywriter -Drink alcoholic beverages -Take any medication unless instructed by your physician -Make any legal decisions or sign important papers.  Meals: Start with liquid foods such as gelatin or soup. Progress to regular foods as tolerated. Avoid greasy, spicy, heavy foods. If nausea and/or vomiting occur, drink only clear liquids until the nausea and/or vomiting subsides. Call your physician if vomiting continues.  Special Instructions/Symptoms: Your throat may feel dry or sore from the anesthesia or the breathing tube placed in your throat during surgery. If this causes discomfort, gargle with warm salt water. The  discomfort should disappear within 24 hours.  If you had a scopolamine patch placed behind your ear for the management of post- operative nausea and/or vomiting:  1. The medication in the patch is effective for 72 hours, after which it should be removed.  Wrap patch in a tissue and discard in the trash. Wash hands thoroughly with soap and water. 2. You may remove the patch earlier than 72 hours if you experience unpleasant side effects which may include dry mouth, dizziness or visual disturbances. 3. Avoid touching the patch. Wash your hands with soap and water after contact with the patch.      Regional Anesthesia Blocks  1. Numbness or the inability to move the "blocked" extremity may last from 3-48 hours after placement. The length of time depends on the medication injected and your individual response to the medication. If the numbness is not going away after 48 hours, call your surgeon.  2. The extremity that is blocked will need to be protected until the numbness is gone and the  Strength has returned. Because you cannot feel it, you will need to take extra care to avoid injury. Because it may be weak, you may have difficulty moving it or using it. You may not know what position it is in without looking at it while the block is in effect.  3. For blocks in the legs and feet, returning to weight bearing and walking needs to be done carefully. You will need to wait until the numbness is entirely gone and the strength has returned. You should be able  to move your leg and foot normally before you try and bear weight or walk. You will need someone to be with you when you first try to ensure you do not fall and possibly risk injury.  4. Bruising and tenderness at the needle site are common side effects and will resolve in a few days.  5. Persistent numbness or new problems with movement should be communicated to the surgeon or the [facility] [phone] [facility] [phone]).

## 2020-08-18 ENCOUNTER — Encounter (HOSPITAL_BASED_OUTPATIENT_CLINIC_OR_DEPARTMENT_OTHER): Payer: Self-pay | Admitting: Orthopedic Surgery

## 2020-08-18 NOTE — Addendum Note (Signed)
Addendum  created 08/18/20 1142 by Cowan Pilar, Jewel Baize, CRNA   Charge Capture section accepted

## 2020-10-22 ENCOUNTER — Encounter: Payer: Self-pay | Admitting: Family Medicine

## 2020-12-16 NOTE — Progress Notes (Signed)
[name] [address] [place] [address] Phone: [phone] Subjective:   Sandra Stanton, am serving as a scribe for Dr. Antoine Primas.  I'm seeing this patient by the request  of:  Scifres, Nicole Cella, PA-C  CC: Right elbow pain follow-up  [id number]  ROMONDA PARKER is a 60 y.o. female coming in with complaint of R elbow pain. Last seen for lateral epicondylitis on 07/13/2020. Underwent lateral debridement and tendon repair of R elbow on 08/17/2020. Patient states that she has done her PT and the exercises up until now. Patient states that she has some concerns if caring a light grocery bag down it bothers her a lot. Patient has good days and bad days, more bad than good with the PT        Past Medical History:  Diagnosis Date   Anxiety    Depression    Hypothyroidism    PONV (postoperative nausea and vomiting)    Past Surgical History:  Procedure Laterality Date   ABDOMINAL HYSTERECTOMY     BREAST BIOPSY  2008   CESAREAN SECTION  [id number]   GALLBLADDER SURGERY  2007   TENNIS ELBOW RELEASE/NIRSCHEL PROCEDURE Right 08/17/2020   Procedure: RIGHT ELBOW LATERAL DEBRIDEMENT AND TENDON REPAIR;  Surgeon: Jones Broom, MD;  Location: [facility];  Service: Orthopedics;  Laterality: Right;  REGIONAL   Social History   Socioeconomic History   Marital status: Married    Spouse name: Not on file   Number of children: Not on file   Years of education: Not on file   Highest education level: Not on file  Occupational History   Not on file  Tobacco Use   Smoking status: Former   Smokeless tobacco: Never  Substance and Sexual Activity   Alcohol use: Yes    Comment: 3x per week   Drug use: Never   Sexual activity: Not on file  Other Topics Concern   Not on file  Social History Narrative   Not on file   Social Determinants of Health   Financial Resource Strain: Not on file  Food Insecurity: Not on file   Transportation Needs: Not on file  Physical Activity: Not on file  Stress: Not on file  Social Connections: Not on file   Allergies  Allergen Reactions   Codeine Nausea And Vomiting   Morphine And Related Nausea And Vomiting   Penicillins Hives   No family history on file.  Current Outpatient Medications (Endocrine & Metabolic):    ARMOUR THYROID PO, Take by mouth.   predniSONE (DELTASONE) 20 MG tablet, Take 1 tablet (20 mg total) by mouth daily with breakfast.    Current Outpatient Medications (Analgesics):    HYDROcodone-acetaminophen (NORCO/VICODIN) 5-325 MG tablet, Take 1 tablet by mouth every 4 (four) hours as needed for moderate pain.   Current Outpatient Medications (Other):    buPROPion (WELLBUTRIN) 100 MG tablet, Take 100 mg by mouth 2 (two) times daily.   calcium carbonate (OS-CAL - DOSED IN MG OF ELEMENTAL CALCIUM) 1250 (500 Ca) MG tablet, Take 1 tablet by mouth.   escitalopram (LEXAPRO) 10 MG tablet, Take 10 mg by mouth daily.   Omega-3 Fatty Acids (FISH OIL) 1000 MG CAPS, Take by mouth.   vitamin k 100 MCG tablet, Take 100 mcg by mouth daily.   tiZANidine (ZANAFLEX) 4 MG tablet, Take 1 tablet (4 mg total) by mouth every 8 (eight) hours as needed for muscle spasms.   Reviewed prior  external information including notes and imaging from  primary care provider As well as notes that were available from care everywhere and other healthcare systems.  Past medical history, social, surgical and family history all reviewed in electronic medical record.  No pertanent information unless stated regarding to the chief complaint.   Review of Systems:  No headache, visual changes, nausea, vomiting, diarrhea, constipation, dizziness, abdominal pain, skin rash, fevers, chills, night sweats, weight loss, swollen lymph nodes, body aches, joint swelling, chest pain, shortness of breath, mood changes. POSITIVE muscle aches  Objective  Blood pressure 130/80, pulse 82, height 5\' 6"   (1.676 m), weight 152 lb (68.9 kg), SpO2 97 %.   General: No apparent distress alert and oriented x3 mood and affect normal, dressed appropriately.  HEENT: Pupils equal, extraocular movements intact  Respiratory: Patient's speak in full sentences and does not appear short of breath  Cardiovascular: No lower extremity edema, non tender, no erythema  Gait normal with good balance and coordination.  MSK: Right elbow shows the patient did have the incision previously.  Patient is still tender to palpation over the lateral epicondylar region.  Does have pain with resisted extension of the wrist.  The resisted extension of the middle finger.  Good grip strength noted but does cause pain.  Limited muscular skeletal ultrasound was performed and interpreted by , M  Limited musculoskeletal ultrasound did show that patient does have what appears to be a more acute tear noted of the common extensor tendon near the origin at the lateral epicondylar region.  Increasing Doppler flow noted in the interstitial aspect of the tendon.  No avulsion noted. Impression: New tear of the common extensor tendon   Impression and Recommendations:     The above documentation has been reviewed and is accurate and complete Antoine Primas, DO

## 2020-12-17 ENCOUNTER — Ambulatory Visit: Payer: 59 | Admitting: Family Medicine

## 2020-12-17 ENCOUNTER — Ambulatory Visit (INDEPENDENT_AMBULATORY_CARE_PROVIDER_SITE_OTHER): Payer: 59 | Admitting: Family Medicine

## 2020-12-17 ENCOUNTER — Encounter: Payer: Self-pay | Admitting: Family Medicine

## 2020-12-17 ENCOUNTER — Other Ambulatory Visit: Payer: Self-pay

## 2020-12-17 ENCOUNTER — Ambulatory Visit: Payer: Self-pay

## 2020-12-17 VITALS — BP 130/80 | HR 82 | Ht 66.0 in | Wt 152.0 lb

## 2020-12-17 DIAGNOSIS — M7711 Lateral epicondylitis, right elbow: Secondary | ICD-10-CM

## 2020-12-17 DIAGNOSIS — M25521 Pain in right elbow: Secondary | ICD-10-CM

## 2020-12-17 MED ORDER — TIZANIDINE HCL 4 MG PO TABS: 4.0000 mg | ORAL_TABLET | Freq: Three times a day (TID) | ORAL | 1 refills | Status: AC | PRN

## 2020-12-17 MED ORDER — PREDNISONE 20 MG PO TABS: 20.0000 mg | ORAL_TABLET | Freq: Every day | ORAL | 0 refills | Status: AC

## 2020-12-17 NOTE — Patient Instructions (Addendum)
Good to see you  Prednisone 20mg  daily for 7 days Stop all exercises for elbow Let them baby you at work  Leg day everyday the gym  See me again in 4 weeks

## 2020-12-17 NOTE — Assessment & Plan Note (Signed)
On ultrasound unfortunately does seem that patient does have a potential tear noted.  We will start a low-dose of prednisone.  Discussed which activities to doing which wants to avoid.  Patient will stop some of the physical therapy that she has been doing as well.  Follow-up with me again 6 weeks

## 2021-01-12 NOTE — Progress Notes (Signed)
Sandra Stanton [address] [place] [address] Phone: [phone] Subjective:   Sandra Stanton, am serving as a scribe for Dr. Antoine Primas. This visit occurred during the SARS-CoV-2 public health emergency.  Safety protocols were in place, including screening questions prior to the visit, additional usage of staff PPE, and extensive cleaning of exam room while observing appropriate contact time as indicated for disinfecting solutions.   I'm seeing this patient by the request  of:  Wilfrid Lund, [place]  CC: Right elbow pain follow-up  [id number]  12/17/2020 On ultrasound unfortunately does seem that patient does have a potential tear noted.  We will start a low-dose of prednisone.  Discussed which activities to doing which wants to avoid.  Patient will stop some of the physical therapy that she has been doing as well.  Follow-up with me again 6 weeks  Updated 01/14/2021 Sandra Stanton is a 60 y.o. female coming in with complaint of right elbow pain. She felt a pop last week when reaching. Overall it has been going okay.  Still has pain daily.  Not stopping her from activity but she is still not doing any heavy lifting and work.  Patient has been doing the home exercises and avoiding certain activities.       Past Medical History:  Diagnosis Date   Anxiety    Depression    Hypothyroidism    PONV (postoperative nausea and vomiting)    Past Surgical History:  Procedure Laterality Date   ABDOMINAL HYSTERECTOMY     BREAST BIOPSY  2008   CESAREAN SECTION  [id number]   GALLBLADDER SURGERY  2007   TENNIS ELBOW RELEASE/NIRSCHEL PROCEDURE Right 08/17/2020   Procedure: RIGHT ELBOW LATERAL DEBRIDEMENT AND TENDON REPAIR;  Surgeon: Jones Broom, MD;  Location: [facility];  Service: Orthopedics;  Laterality: Right;  REGIONAL   Social History   Socioeconomic History   Marital status: Married    Spouse name: Not on file   Number of  children: Not on file   Years of education: Not on file   Highest education level: Not on file  Occupational History   Not on file  Tobacco Use   Smoking status: Former   Smokeless tobacco: Never  Substance and Sexual Activity   Alcohol use: Yes    Comment: 3x per week   Drug use: Never   Sexual activity: Not on file  Other Topics Concern   Not on file  Social History Narrative   Not on file   Social Determinants of Health   Financial Resource Strain: Not on file  Food Insecurity: Not on file  Transportation Needs: Not on file  Physical Activity: Not on file  Stress: Not on file  Social Connections: Not on file   Allergies  Allergen Reactions   Codeine Nausea And Vomiting   Morphine And Related Nausea And Vomiting   Penicillins Hives   No family history on file.  Current Outpatient Medications (Endocrine & Metabolic):    ARMOUR THYROID PO, Take by mouth.   predniSONE (DELTASONE) 20 MG tablet, Take 1 tablet (20 mg total) by mouth daily with breakfast.    Current Outpatient Medications (Analgesics):    HYDROcodone-acetaminophen (NORCO/VICODIN) 5-325 MG tablet, Take 1 tablet by mouth every 4 (four) hours as needed for moderate pain.   Current Outpatient Medications (Other):    buPROPion (WELLBUTRIN) 100 MG tablet, Take 100 mg by mouth 2 (two) times daily.   calcium carbonate (  OS-CAL - DOSED IN MG OF ELEMENTAL CALCIUM) 1250 (500 Ca) MG tablet, Take 1 tablet by mouth.   escitalopram (LEXAPRO) 10 MG tablet, Take 10 mg by mouth daily.   Omega-3 Fatty Acids (FISH OIL) 1000 MG CAPS, Take by mouth.   tiZANidine (ZANAFLEX) 4 MG tablet, Take 1 tablet (4 mg total) by mouth every 8 (eight) hours as needed for muscle spasms.   vitamin k 100 MCG tablet, Take 100 mcg by mouth daily.   Reviewed prior external information including notes and imaging from  primary care provider As well as notes that were available from care everywhere and other healthcare systems.  Past medical  history, social, surgical and family history all reviewed in electronic medical record.  No pertanent information unless stated regarding to the chief complaint.   Review of Systems:  No headache, visual changes, nausea, vomiting, diarrhea, constipation, dizziness, abdominal pain, skin rash, fevers, chills, night sweats, weight loss, swollen lymph nodes, body aches, joint swelling, chest pain, shortness of breath, mood changes. POSITIVE muscle aches  Objective  Blood pressure 120/76, pulse 95, height 5\' 6"  (1.676 m), weight 152 lb (68.9 kg), SpO2 97 %.   General: No apparent distress alert and oriented x3 mood and affect normal, dressed appropriately.  HEENT: Pupils equal, extraocular movements intact  Respiratory: Patient's speak in full sentences and does not appear short of breath  Cardiovascular: No lower extremity edema, non tender, no erythema  Gait normal with good balance and coordination.  MSK: Right elbow exam shows the patient does have tenderness to palpation over the lateral epicondylar r  Some mild postsurgical changes still noted.  Patient does have good grip strength noted.   Limited musculoskeletal ultrasound was performed and interpreted by  Limited ultrasound shows that patient does have still significant neovascularization and hypoechoic changes in the common extensor tendon at the insertion of the lateral epicondylar region.  No significant retraction noted. Impression: Continued abnormality of the common extensor tendon with interstitial tearing    Impression and Recommendations:     The above documentation has been reviewed and is accurate and complete Judi Saa, DO

## 2021-01-14 ENCOUNTER — Ambulatory Visit: Payer: Self-pay

## 2021-01-14 ENCOUNTER — Encounter: Payer: Self-pay | Admitting: Family Medicine

## 2021-01-14 ENCOUNTER — Other Ambulatory Visit: Payer: Self-pay

## 2021-01-14 ENCOUNTER — Ambulatory Visit (INDEPENDENT_AMBULATORY_CARE_PROVIDER_SITE_OTHER): Payer: 59 | Admitting: Family Medicine

## 2021-01-14 VITALS — BP 120/76 | HR 95 | Ht 66.0 in | Wt 152.0 lb

## 2021-01-14 DIAGNOSIS — M7711 Lateral epicondylitis, right elbow: Secondary | ICD-10-CM

## 2021-01-14 NOTE — Patient Instructions (Signed)
Keep doing what you're doing See you again in 5-6 weeks

## 2021-01-14 NOTE — Assessment & Plan Note (Signed)
Patient does have still what appears to be a tear noted.  Seems to be more though of the intersubstance.  Patient has been making some improvement overall but still cannot make significant breakthrough.  Discussed avoiding certain activities such as being on the computer for longer durations of time patient will get the ergonomic changes and see how she does.  Follow-up with me again 6 to 8 weeks

## 2021-02-17 NOTE — Progress Notes (Signed)
[facility] [address] [place] [address] Phone: [phone] Subjective:   Sandra Stanton, am serving as a scribe for Dr. Antoine Primas. This visit occurred during the SARS-CoV-2 public health emergency.  Safety protocols were in place, including screening questions prior to the visit, additional usage of staff PPE, and extensive cleaning of exam room while observing appropriate contact time as indicated for disinfecting solutions.   I'm seeing this patient by the request  of:  Wilfrid Lund, [place]  CC: Elbow pain follow-up  [id number]  01/14/2021 Patient does have still what appears to be a tear noted.  Seems to be more though of the intersubstance.  Patient has been making some improvement overall but still cannot make significant breakthrough.  Discussed avoiding certain activities such as being on the computer for longer durations of time patient will get the ergonomic changes and see how she does.  Follow-up with me again 6 to 8 weeks  Updated 02/18/2021 Sandra Stanton is a 60 y.o. female coming in with complaint of elbow pain. Wears wrist brace to stop movement. Pain is better. Not constant. Feels uncomfortable with movement, then more intense later on that day. Patient did state that the severity seems to be improving.     Past Medical History:  Diagnosis Date   Anxiety    Depression    Hypothyroidism    PONV (postoperative nausea and vomiting)    Past Surgical History:  Procedure Laterality Date   ABDOMINAL HYSTERECTOMY     BREAST BIOPSY  2008   CESAREAN SECTION  0254,2706   GALLBLADDER SURGERY  2007   TENNIS ELBOW RELEASE/NIRSCHEL PROCEDURE Right 08/17/2020   Procedure: RIGHT ELBOW LATERAL DEBRIDEMENT AND TENDON REPAIR;  Surgeon: Jones Broom, MD;  Location: [facility];  Service: Orthopedics;  Laterality: Right;  REGIONAL   Social History   Socioeconomic History   Marital status: Married    Spouse name:  Not on file   Number of children: Not on file   Years of education: Not on file   Highest education level: Not on file  Occupational History   Not on file  Tobacco Use   Smoking status: Former   Smokeless tobacco: Never  Substance and Sexual Activity   Alcohol use: Yes    Comment: 3x per week   Drug use: Never   Sexual activity: Not on file  Other Topics Concern   Not on file  Social History Narrative   Not on file   Social Determinants of Health   Financial Resource Strain: Not on file  Food Insecurity: Not on file  Transportation Needs: Not on file  Physical Activity: Not on file  Stress: Not on file  Social Connections: Not on file   Allergies  Allergen Reactions   Codeine Nausea And Vomiting   Morphine And Related Nausea And Vomiting   Penicillins Hives   No family history on file.  Current Outpatient Medications (Endocrine & Metabolic):    ARMOUR THYROID PO, Take by mouth.   predniSONE (DELTASONE) 20 MG tablet, Take 1 tablet (20 mg total) by mouth daily with breakfast.    Current Outpatient Medications (Analgesics):    HYDROcodone-acetaminophen (NORCO/VICODIN) 5-325 MG tablet, Take 1 tablet by mouth every 4 (four) hours as needed for moderate pain.   Current Outpatient Medications (Other):    buPROPion (WELLBUTRIN) 100 MG tablet, Take 100 mg by mouth 2 (two) times daily.   calcium carbonate (OS-CAL - DOSED IN  MG OF ELEMENTAL CALCIUM) 1250 (500 Ca) MG tablet, Take 1 tablet by mouth.   escitalopram (LEXAPRO) 10 MG tablet, Take 10 mg by mouth daily.   Omega-3 Fatty Acids (FISH OIL) 1000 MG CAPS, Take by mouth.   tiZANidine (ZANAFLEX) 4 MG tablet, Take 1 tablet (4 mg total) by mouth every 8 (eight) hours as needed for muscle spasms.   vitamin k 100 MCG tablet, Take 100 mcg by mouth daily.    Review of Systems:  No headache, visual changes, nausea, vomiting, diarrhea, constipation, dizziness, abdominal pain, skin rash, fevers, chills, night sweats, weight  loss, swollen lymph nodes, body aches, joint swelling, chest pain, shortness of breath, mood changes.   Objective  Blood pressure 110/80, pulse 99, height 5\' 6"  (1.676 m), weight 156 lb (70.8 kg), SpO2 97 %.   General: No apparent distress alert and oriented x3 mood and affect normal, dressed appropriately.  HEENT: Pupils equal, extraocular movements intact  Respiratory: Patient's speak in full sentences and does not appear short of breath  Cardiovascular: No lower extremity edema, non tender, no erythema  Gait normal with good balance and coordination.  MSK: Right elbow exam shows the patient is minorly tender to palpation over the lateral epicondylar region.  Good grip strength noted.  No significant swelling noted in the area which is an improvement  Limited muscular skeletal ultrasound was performed and interpreted by , M  Limited ultrasound of patient's lateral epicondylar region shows the common extensor tendon does have some scarring noted of the previous tear noted.  Patient does have still some neovascularization noted.  No new tear appreciated. Impression: Interval improvement from previous exam.   Impression and Recommendations:     The above documentation has been reviewed and is accurate and complete Antoine Primas, DO

## 2021-02-18 ENCOUNTER — Encounter: Payer: Self-pay | Admitting: Family Medicine

## 2021-02-18 ENCOUNTER — Ambulatory Visit (INDEPENDENT_AMBULATORY_CARE_PROVIDER_SITE_OTHER): Payer: 59 | Admitting: Family Medicine

## 2021-02-18 ENCOUNTER — Other Ambulatory Visit: Payer: Self-pay

## 2021-02-18 ENCOUNTER — Ambulatory Visit: Payer: Self-pay

## 2021-02-18 VITALS — BP 110/80 | HR 99 | Ht 66.0 in | Wt 156.0 lb

## 2021-02-18 DIAGNOSIS — M7711 Lateral epicondylitis, right elbow: Secondary | ICD-10-CM | POA: Diagnosis not present

## 2021-02-18 NOTE — Patient Instructions (Signed)
Do see progress on ultra sound which is fantastic Enjoy 2 weeks of (vacation) Increase activity including cutting in 4 weeks See you again in 6 weeks

## 2021-02-18 NOTE — Assessment & Plan Note (Signed)
Patient on ultrasound does show some improvement from previous exam.  Still has some neovascularization in the tendon itself but overall does seem to have some scar tissue formation in the tendon which she is showing a significant improvement.  At this point I would like patient to slowly increase her activity and in 4 weeks hopefully will be doing full activity at work and see how patient is doing.  Patient has improvement but she is cautiously optimistic with how long this has been taking.  Patient will follow up with me again in 6 weeks.

## 2021-03-16 IMAGING — DX DG ELBOW 2V*R*
2 series · 2 of 2 positions shown · non-contrast
Comparison: None.

CLINICAL DATA: Elbow pain

EXAM:
RIGHT ELBOW - 2 VIEW

[elbow ap]
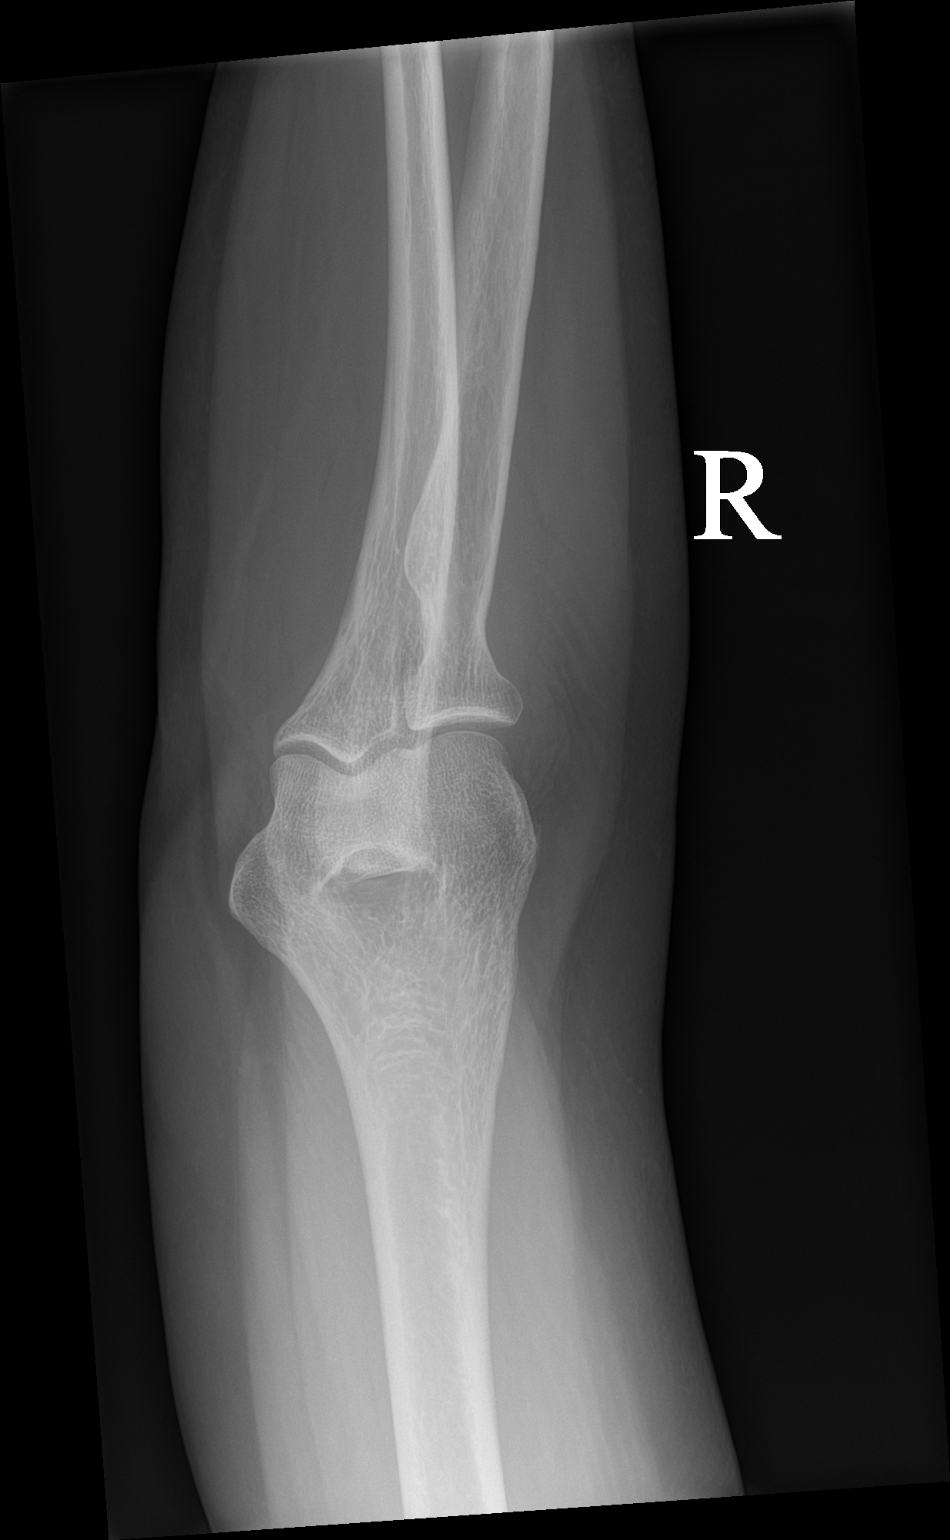

[elbow lat]
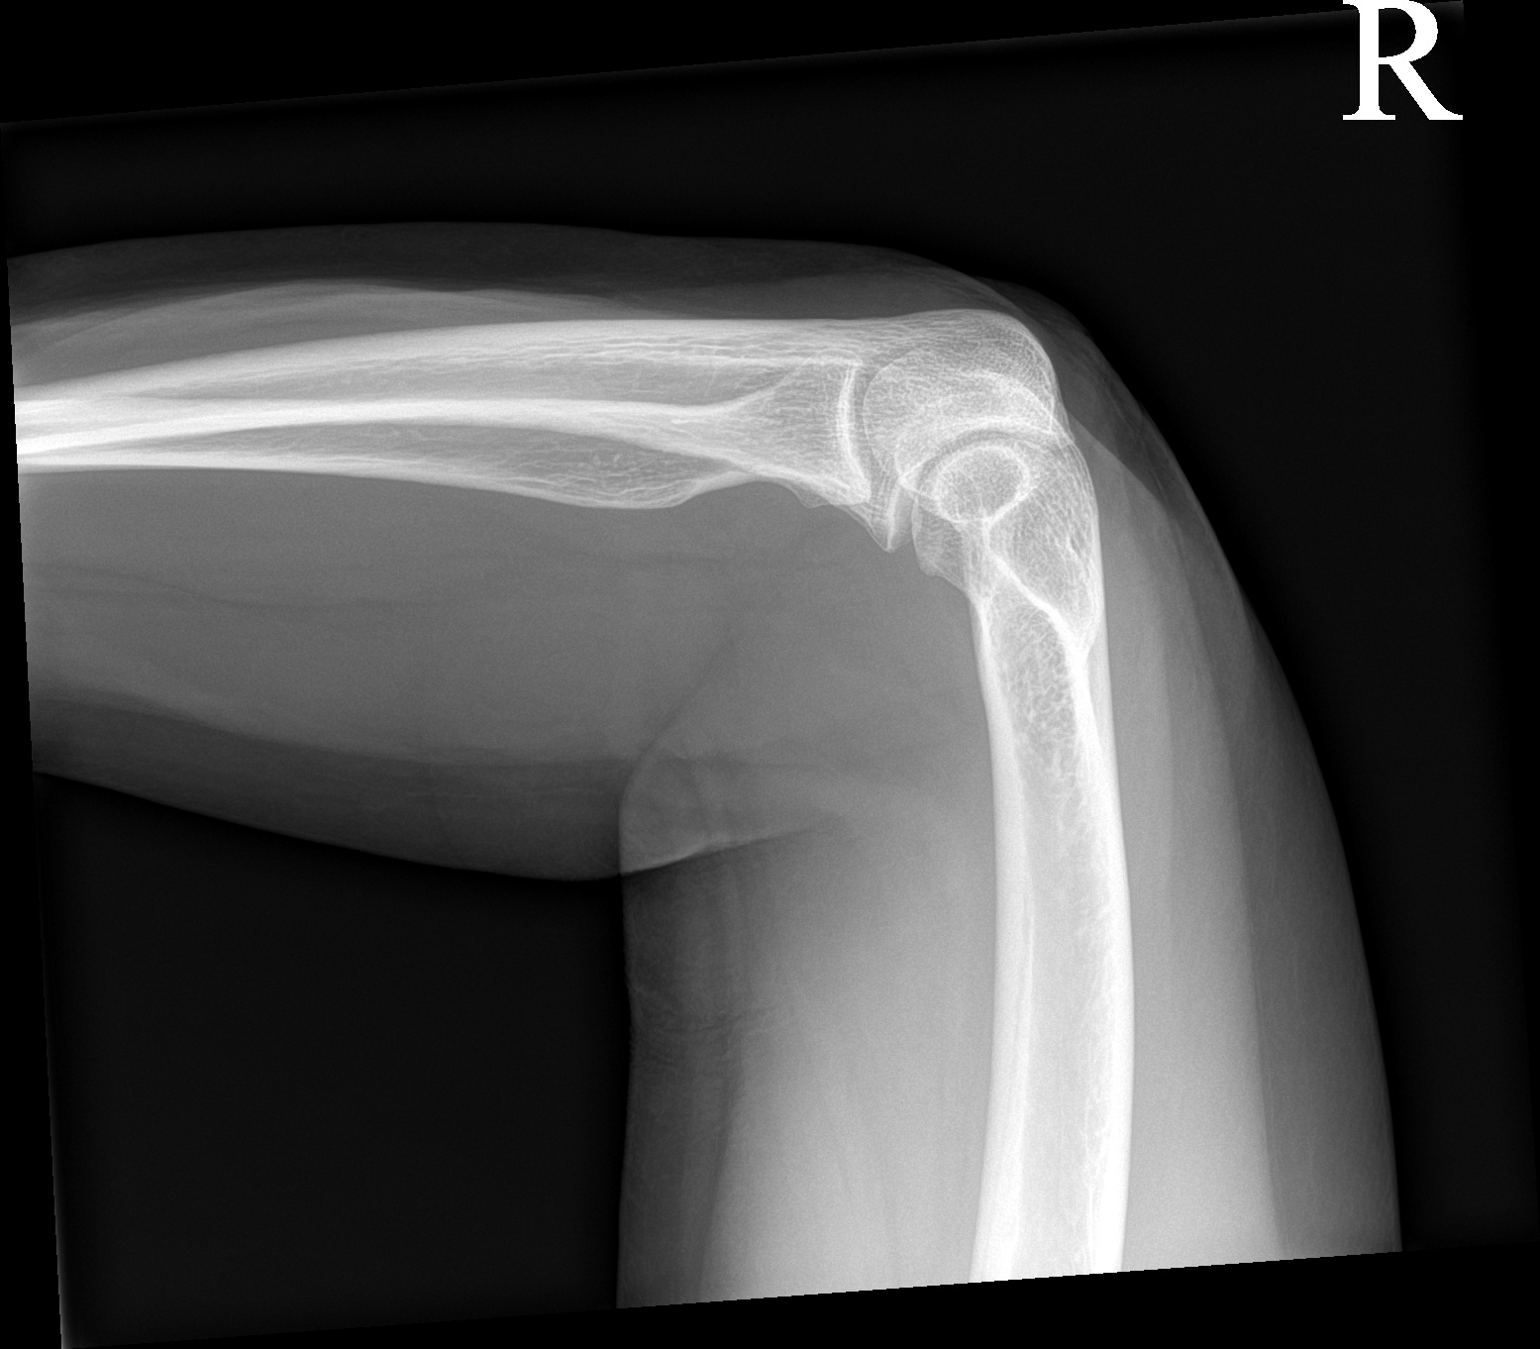

[2 of 2 positions shown; findings below may reference images not displayed]

FINDINGS: There is no evidence of fracture, dislocation, or joint effusion.
There is no evidence of arthropathy or other focal bone abnormality.
Soft tissues are unremarkable.
IMPRESSION: Negative.

## 2021-03-23 NOTE — Progress Notes (Signed)
[facility] [address] [place] [address] Phone: [phone] Subjective:   Sandra Stanton, am serving as a scribe for Dr. Antoine Primas. This visit occurred during the SARS-CoV-2 public health emergency.  Safety protocols were in place, including screening questions prior to the visit, additional usage of staff PPE, and extensive cleaning of exam room while observing appropriate contact time as indicated for disinfecting solutions.   I'm seeing this patient by the request  of:  Wilfrid Lund, [place]  CC: Right elbow pain follow-up  [id number]  02/18/2021 Patient on ultrasound does show some improvement from previous exam.  Still has some neovascularization in the tendon itself but overall does seem to have some scar tissue formation in the tendon which she is showing a significant improvement.  At this point I would like patient to slowly increase her activity and in 4 weeks hopefully will be doing full activity at work and see how patient is doing.  Patient has improvement but she is cautiously optimistic with how long this has been taking.  Patient will follow up with me again in 6 weeks.  Updated 03/24/2021 Sandra Stanton is a 60 y.o. female coming in with complaint of right elbow pain feel like its slowly getting better. 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 Careful with carry things. She is doing the bands at home Back off of doing things when pain presents itself. Would state that she is feeling approximately another 60% better.      Past Medical History:  Diagnosis Date   Anxiety    Depression    Hypothyroidism    PONV (postoperative nausea and vomiting)    Past Surgical History:  Procedure Laterality Date   ABDOMINAL HYSTERECTOMY     BREAST BIOPSY  2008   CESAREAN SECTION  [id number]   GALLBLADDER SURGERY  2007   TENNIS ELBOW RELEASE/NIRSCHEL PROCEDURE Right 08/17/2020   Procedure: RIGHT ELBOW LATERAL DEBRIDEMENT AND TENDON  REPAIR;  Surgeon: Jones Broom, MD;  Location: [facility];  Service: Orthopedics;  Laterality: Right;  REGIONAL   Social History   Socioeconomic History   Marital status: Married    Spouse name: Not on file   Number of children: Not on file   Years of education: Not on file   Highest education level: Not on file  Occupational History   Not on file  Tobacco Use   Smoking status: Former   Smokeless tobacco: Never  Substance and Sexual Activity   Alcohol use: Yes    Comment: 3x per week   Drug use: Never   Sexual activity: Not on file  Other Topics Concern   Not on file  Social History Narrative   Not on file   Social Determinants of Health   Financial Resource Strain: Not on file  Food Insecurity: Not on file  Transportation Needs: Not on file  Physical Activity: Not on file  Stress: Not on file  Social Connections: Not on file   Allergies  Allergen Reactions   Codeine Nausea And Vomiting   Morphine And Related Nausea And Vomiting   Penicillins Hives   No family history on file.  Current Outpatient Medications (Endocrine & Metabolic):    ARMOUR THYROID PO, Take by mouth.   predniSONE (DELTASONE) 20 MG tablet, Take 1 tablet (20 mg total) by mouth daily with breakfast.    Current Outpatient Medications (Analgesics):    HYDROcodone-acetaminophen (NORCO/VICODIN) 5-325 MG tablet, Take 1 tablet by  mouth every 4 (four) hours as needed for moderate pain.   Current Outpatient Medications (Other):    buPROPion (WELLBUTRIN) 100 MG tablet, Take 100 mg by mouth 2 (two) times daily.   calcium carbonate (OS-CAL - DOSED IN MG OF ELEMENTAL CALCIUM) 1250 (500 Ca) MG tablet, Take 1 tablet by mouth.   escitalopram (LEXAPRO) 10 MG tablet, Take 10 mg by mouth daily.   Omega-3 Fatty Acids (FISH OIL) 1000 MG CAPS, Take by mouth.   tiZANidine (ZANAFLEX) 4 MG tablet, Take 1 tablet (4 mg total) by mouth every 8 (eight) hours as needed for muscle spasms.   vitamin k 100  MCG tablet, Take 100 mcg by mouth daily.     Objective  Blood pressure 118/64, pulse 82, height 5\' 6"  (1.676 m), weight 158 lb (71.7 kg), SpO2 98 %.   General: No apparent distress alert and oriented x3 mood and affect normal, dressed appropriately.  HEENT: Pupils equal, extraocular movements intact  Respiratory: Patient's speak in full sentences and does not appear short of breath  Cardiovascular: No lower extremity edema, non tender, no erythema  Gait normal with good balance and coordination.  MSK: Right elbow exam still shows some mild tenderness noted over the lateral epicondylar region.  Does have pain with resisted wrist extension.   Limited muscular skeletal ultrasound was performed and interpreted by , M  Limited ultrasound of patient's common extensor tendon does have some mild hypoechoic changes noted.  And some increasing in neovascularization.  Patient though with the area where there was a tear previously seems to be approximately 60% healed with more scar tissue formation noted. Impression: Interval improvement     Impression and Recommendations:     The above documentation has been reviewed and is accurate and complete Antoine Primas, DO

## 2021-03-24 ENCOUNTER — Ambulatory Visit: Payer: Self-pay

## 2021-03-24 ENCOUNTER — Other Ambulatory Visit: Payer: Self-pay

## 2021-03-24 ENCOUNTER — Encounter: Payer: Self-pay | Admitting: Family Medicine

## 2021-03-24 ENCOUNTER — Ambulatory Visit (INDEPENDENT_AMBULATORY_CARE_PROVIDER_SITE_OTHER): Payer: 59 | Admitting: Family Medicine

## 2021-03-24 VITALS — BP 118/64 | HR 82 | Ht 66.0 in | Wt 158.0 lb

## 2021-03-24 DIAGNOSIS — M7711 Lateral epicondylitis, right elbow: Secondary | ICD-10-CM

## 2021-03-24 NOTE — Assessment & Plan Note (Signed)
Patient has responded relatively well again.  Does notice some improvement noted at this time.  Does have improvement in strength.  Patient will continue with conservative therapy and see me again in 2 months and potentially if doing very well can be released at that time

## 2021-03-24 NOTE — Patient Instructions (Signed)
Good to see you! Do see some good healing going on See you again in 2 months

## 2021-05-21 NOTE — Progress Notes (Signed)
[name] [address] [place] [address] Phone: [phone] Subjective:   Sandra Stanton, am serving as a scribe for Dr. Antoine Primas. This visit occurred during the SARS-CoV-2 public health emergency.  Safety protocols were in place, including screening questions prior to the visit, additional usage of staff PPE, and extensive cleaning of exam room while observing appropriate contact time as indicated for disinfecting solutions.   I'm seeing this patient by the request  of:  Wilfrid Lund, [place]  CC: Elbow pain follow-up  [id number]  03/24/2021 Patient has responded relatively well again.  Does notice some improvement noted at this time.  Does have improvement in strength.  Patient will continue with conservative therapy and see me again in 2 months and potentially if doing very well can be released at that time  Updated 05/24/2021 Sandra Stanton is a 61 y.o. female coming in with complaint of lateral epicondylitis. Getting progressively better, but now medial side is beginning to become irritated. No other complaints.  More pain on the medial aspect.  Symptoms seems to be more irritated with pressure.       Past Medical History:  Diagnosis Date   Anxiety    Depression    Hypothyroidism    PONV (postoperative nausea and vomiting)    Past Surgical History:  Procedure Laterality Date   ABDOMINAL HYSTERECTOMY     BREAST BIOPSY  2008   CESAREAN SECTION  [id number]   GALLBLADDER SURGERY  2007   TENNIS ELBOW RELEASE/NIRSCHEL PROCEDURE Right 08/17/2020   Procedure: RIGHT ELBOW LATERAL DEBRIDEMENT AND TENDON REPAIR;  Surgeon: Jones Broom, MD;  Location: [facility];  Service: Orthopedics;  Laterality: Right;  REGIONAL   Social History   Socioeconomic History   Marital status: Married    Spouse name: Not on file   Number of children: Not on file   Years of education: Not on file   Highest education level: Not on  file  Occupational History   Not on file  Tobacco Use   Smoking status: Former   Smokeless tobacco: Never  Substance and Sexual Activity   Alcohol use: Yes    Comment: 3x per week   Drug use: Never   Sexual activity: Not on file  Other Topics Concern   Not on file  Social History Narrative   Not on file   Social Determinants of Health   Financial Resource Strain: Not on file  Food Insecurity: Not on file  Transportation Needs: Not on file  Physical Activity: Not on file  Stress: Not on file  Social Connections: Not on file   Allergies  Allergen Reactions   Codeine Nausea And Vomiting   Morphine And Related Nausea And Vomiting   Penicillins Hives   No family history on file.  Current Outpatient Medications (Endocrine & Metabolic):    ARMOUR THYROID PO, Take by mouth.   predniSONE (DELTASONE) 20 MG tablet, Take 1 tablet (20 mg total) by mouth daily with breakfast.    Current Outpatient Medications (Analgesics):    HYDROcodone-acetaminophen (NORCO/VICODIN) 5-325 MG tablet, Take 1 tablet by mouth every 4 (four) hours as needed for moderate pain.   Current Outpatient Medications (Other):    buPROPion (WELLBUTRIN) 100 MG tablet, Take 100 mg by mouth 2 (two) times daily.   calcium carbonate (OS-CAL - DOSED IN MG OF ELEMENTAL CALCIUM) 1250 (500 Ca) MG tablet, Take 1 tablet by mouth.   escitalopram (LEXAPRO) 10 MG tablet, Take  10 mg by mouth daily.   Omega-3 Fatty Acids (FISH OIL) 1000 MG CAPS, Take by mouth.   tiZANidine (ZANAFLEX) 4 MG tablet, Take 1 tablet (4 mg total) by mouth every 8 (eight) hours as needed for muscle spasms.   vitamin k 100 MCG tablet, Take 100 mcg by mouth daily.   Reviewed prior external information including notes and imaging from  primary care provider As well as notes that were available from care everywhere and other healthcare systems.  Past medical history, social, surgical and family history all reviewed in electronic medical record.  No  pertanent information unless stated regarding to the chief complaint.   Review of Systems:  No headache, visual changes, nausea, vomiting, diarrhea, constipation, dizziness, abdominal pain, skin rash, fevers, chills, night sweats, weight loss, swollen lymph nodes, body aches, joint swelling, chest pain, shortness of breath, mood changes. POSITIVE muscle aches  Objective  Blood pressure 128/84, pulse 96, height 5\' 6"  (1.676 m), weight 155 lb (70.3 kg), SpO2 97 %.   General: No apparent distress alert and oriented x3 mood and affect normal, dressed appropriately.  HEENT: Pupils equal, extraocular movements intact  Respiratory: Patient's speak in full sentences and does not appear short of breath  Cardiovascular: No lower extremity edema, non tender, no erythema  Patient's right elbow does have tenderness to palpation minorly over the lateral epicondylar region but improvement noted.  Improvement in strength also noted.  Limited muscular skeletal ultrasound was performed and interpreted by , M  Limited ultrasound of patient's right elbow shows the patient's lateral epicondylar region seems to have improved.  Arthritic formation still noted.  Patient's medial aspect of the elbow joint does have hypoechoic changes of the ulnar nerve and does seem to have some mild chronic subluxation noted in the cubital fossa. Impression: Improvement in the lateral epicondylitis and the interstitial tearing with ulnar subluxation    Impression and Recommendations:     The above documentation has been reviewed and is accurate and complete Antoine Primas, DO

## 2021-05-24 ENCOUNTER — Encounter: Payer: Self-pay | Admitting: Family Medicine

## 2021-05-24 ENCOUNTER — Ambulatory Visit: Payer: Self-pay

## 2021-05-24 ENCOUNTER — Ambulatory Visit (INDEPENDENT_AMBULATORY_CARE_PROVIDER_SITE_OTHER): Payer: 59 | Admitting: Family Medicine

## 2021-05-24 ENCOUNTER — Other Ambulatory Visit: Payer: Self-pay

## 2021-05-24 VITALS — BP 128/84 | HR 96 | Ht 66.0 in | Wt 155.0 lb

## 2021-05-24 DIAGNOSIS — G5620 Lesion of ulnar nerve, unspecified upper limb: Secondary | ICD-10-CM | POA: Insufficient documentation

## 2021-05-24 DIAGNOSIS — M7711 Lateral epicondylitis, right elbow: Secondary | ICD-10-CM

## 2021-05-24 DIAGNOSIS — G5621 Lesion of ulnar nerve, right upper limb: Secondary | ICD-10-CM | POA: Diagnosis not present

## 2021-05-24 NOTE — Patient Instructions (Signed)
Limit ROM Find favorite pillow and use that and don't put on anything hard Augment at work See you again in 6-8 weeks

## 2021-05-24 NOTE — Assessment & Plan Note (Signed)
Concerned that patient is having significant depression in the area.  The patient may even have some mild subluxation occurring.  Discussed with patient to avoid impact, avoid full range of motion of the elbow at the moment.  Discussed icing regimen and home exercises.  Follow-up again in 6 weeks

## 2021-05-24 NOTE — Assessment & Plan Note (Signed)
Right-sided elbow does appear to be better at this point.  Likely will do relatively well.-Follow-up again in 6 weeks

## 2021-05-27 DIAGNOSIS — E039 Hypothyroidism, unspecified: Secondary | ICD-10-CM | POA: Diagnosis not present

## 2021-05-27 DIAGNOSIS — E559 Vitamin D deficiency, unspecified: Secondary | ICD-10-CM | POA: Diagnosis not present

## 2021-05-27 DIAGNOSIS — N959 Unspecified menopausal and perimenopausal disorder: Secondary | ICD-10-CM | POA: Diagnosis not present

## 2021-05-27 DIAGNOSIS — G479 Sleep disorder, unspecified: Secondary | ICD-10-CM | POA: Diagnosis not present

## 2021-05-31 DIAGNOSIS — R232 Flushing: Secondary | ICD-10-CM | POA: Diagnosis not present

## 2021-05-31 DIAGNOSIS — Z8639 Personal history of other endocrine, nutritional and metabolic disease: Secondary | ICD-10-CM | POA: Diagnosis not present

## 2021-05-31 DIAGNOSIS — N951 Menopausal and female climacteric states: Secondary | ICD-10-CM | POA: Diagnosis not present

## 2021-05-31 DIAGNOSIS — R69 Illness, unspecified: Secondary | ICD-10-CM | POA: Diagnosis not present

## 2021-05-31 DIAGNOSIS — B009 Herpesviral infection, unspecified: Secondary | ICD-10-CM | POA: Diagnosis not present

## 2021-05-31 DIAGNOSIS — E039 Hypothyroidism, unspecified: Secondary | ICD-10-CM | POA: Diagnosis not present

## 2021-06-14 DIAGNOSIS — J0100 Acute maxillary sinusitis, unspecified: Secondary | ICD-10-CM | POA: Diagnosis not present

## 2021-07-02 NOTE — Progress Notes (Signed)
[name] [address] [place] [address] Phone: [phone] Subjective:   [name], am serving as a scribe for Dr. Antoine Primas. This visit occurred during the SARS-CoV-2 public health emergency.  Safety protocols were in place, including screening questions prior to the visit, additional usage of staff PPE, and extensive cleaning of exam room while observing appropriate contact time as indicated for disinfecting solutions.   I'm seeing this patient by the request  of:  Wilfrid Lund, [place]  CC: right elbow pain follow up   [id number]  05/24/2021 Concerned that patient is having significant depression in the area.  The patient may even have some mild subluxation occurring.  Discussed with patient to avoid impact, avoid full range of motion of the elbow at the moment.  Discussed icing regimen and home exercises.  Follow-up again in 6 weeks  Right-sided elbow does appear to be better at this point.  Likely will do relatively well.-Follow-up again in 6 weeks  Updated 07/05/2021 Sandra [facility] is a 61 y.o. female coming in with complaint of right elbow pain. Progressively getting better. It is still very bothersome with 2 weeks off work. Unable to do bicep curls with 5lb weights. Not wanting to do PT again. No new complaints.       Past Medical History:  Diagnosis Date   Anxiety    Depression    Hypothyroidism    PONV (postoperative nausea and vomiting)    Past Surgical History:  Procedure Laterality Date   ABDOMINAL HYSTERECTOMY     BREAST BIOPSY  2008   CESAREAN SECTION  [id number]   GALLBLADDER SURGERY  2007   TENNIS ELBOW RELEASE/NIRSCHEL PROCEDURE Right 08/17/2020   Procedure: RIGHT ELBOW LATERAL DEBRIDEMENT AND TENDON REPAIR;  Surgeon: Jones Broom, MD;  Location: [facility];  Service: Orthopedics;  Laterality: Right;  REGIONAL   Social History   Socioeconomic History   Marital status: Married     Spouse name: Not on file   Number of children: Not on file   Years of education: Not on file   Highest education level: Not on file  Occupational History   Not on file  Tobacco Use   Smoking status: Former   Smokeless tobacco: Never  Substance and Sexual Activity   Alcohol use: Yes    Comment: 3x per week   Drug use: Never   Sexual activity: Not on file  Other Topics Concern   Not on file  Social History Narrative   Not on file   Social Determinants of Health   Financial Resource Strain: Not on file  Food Insecurity: Not on file  Transportation Needs: Not on file  Physical Activity: Not on file  Stress: Not on file  Social Connections: Not on file   Allergies  Allergen Reactions   Codeine Nausea And Vomiting   Morphine And Related Nausea And Vomiting   Penicillins Hives   No family history on file.  Current Outpatient Medications (Endocrine & Metabolic):    ARMOUR THYROID PO, Take by mouth.   predniSONE (DELTASONE) 20 MG tablet, Take 1 tablet (20 mg total) by mouth daily with breakfast.    Current Outpatient Medications (Analgesics):    HYDROcodone-acetaminophen (NORCO/VICODIN) 5-325 MG tablet, Take 1 tablet by mouth every 4 (four) hours as needed for moderate pain.   Current Outpatient Medications (Other):    buPROPion (WELLBUTRIN) 100 MG tablet, Take 100 mg by mouth 2 (two) times daily.  calcium carbonate (OS-CAL - DOSED IN MG OF ELEMENTAL CALCIUM) 1250 (500 Ca) MG tablet, Take 1 tablet by mouth.   escitalopram (LEXAPRO) 10 MG tablet, Take 10 mg by mouth daily.   Omega-3 Fatty Acids (FISH OIL) 1000 MG CAPS, Take by mouth.   tiZANidine (ZANAFLEX) 4 MG tablet, Take 1 tablet (4 mg total) by mouth every 8 (eight) hours as needed for muscle spasms.   vitamin k 100 MCG tablet, Take 100 mcg by mouth daily.   Reviewed prior external information including notes and imaging from  primary care provider As well as notes that were available from care everywhere and  other healthcare systems.  Past medical history, social, surgical and family history all reviewed in electronic medical record.  No pertanent information unless stated regarding to the chief complaint.   Review of Systems:  No headache, visual changes, nausea, vomiting, diarrhea, constipation, dizziness, abdominal pain, skin rash, fevers, chills, night sweats, weight loss, swollen lymph nodes, body aches, joint swelling, chest pain, shortness of breath, mood changes. POSITIVE muscle aches  Objective  Blood pressure 118/88, pulse 72, height 5\' 6"  (1.676 m), weight 153 lb (69.4 kg), SpO2 97 %.   General: No apparent distress alert and oriented x3 mood and affect normal, dressed appropriately.  HEENT: Pupils equal, extraocular movements intact  Respiratory: Patient's speak in full sentences and does not appear short of breath  Cardiovascular: No lower extremity edema, non tender, no erythema  Gait normal with good balance and coordination.  MSK:  right elbow no pain on palpation, more tightness medial less on lateral then usual   Limited muscular skeletal ultrasound was performed and interpreted by , M  Limited musculoskeletal ultrasound was performed and interpreted by me showing the patient's lateral common extensor tendon only seems to be completely healed at this time.  Patient's medial common flexor tendon near insertion does have some mild hypoechoic changes in neovascularization.  Does not have a true tear though appreciated.  Does have mild dilatation noted ulnar nerve. Mild medial epicondylitis    Impression and Recommendations:     The above documentation has been reviewed and is accurate and complete Antoine Primas, DO

## 2021-07-05 ENCOUNTER — Encounter: Payer: Self-pay | Admitting: Family Medicine

## 2021-07-05 ENCOUNTER — Ambulatory Visit (INDEPENDENT_AMBULATORY_CARE_PROVIDER_SITE_OTHER): Payer: 59 | Admitting: Family Medicine

## 2021-07-05 ENCOUNTER — Ambulatory Visit: Payer: Self-pay

## 2021-07-05 ENCOUNTER — Other Ambulatory Visit: Payer: Self-pay

## 2021-07-05 VITALS — BP 118/88 | HR 72 | Ht 66.0 in | Wt 153.0 lb

## 2021-07-05 DIAGNOSIS — R059 Cough, unspecified: Secondary | ICD-10-CM | POA: Diagnosis not present

## 2021-07-05 DIAGNOSIS — M7711 Lateral epicondylitis, right elbow: Secondary | ICD-10-CM

## 2021-07-05 DIAGNOSIS — R0981 Nasal congestion: Secondary | ICD-10-CM | POA: Diagnosis not present

## 2021-07-05 DIAGNOSIS — J019 Acute sinusitis, unspecified: Secondary | ICD-10-CM | POA: Diagnosis not present

## 2021-07-05 DIAGNOSIS — R0982 Postnasal drip: Secondary | ICD-10-CM | POA: Diagnosis not present

## 2021-07-05 NOTE — Assessment & Plan Note (Signed)
Patient has made significant improvement at this time.  Would like patient to start increasing activity slowly.  Discussed icing regimen and home exercise, discussed which activities to do and which ones to avoid, increase activity slowly.  Patient will should hopefully do very well.

## 2021-07-05 NOTE — Patient Instructions (Signed)
Good to see you! Travel safe and enjoy [place] Use the bands more than weights Okay to increase activity other wise See you again in 3 months

## 2021-07-19 DIAGNOSIS — R051 Acute cough: Secondary | ICD-10-CM | POA: Diagnosis not present

## 2021-07-19 DIAGNOSIS — J029 Acute pharyngitis, unspecified: Secondary | ICD-10-CM | POA: Diagnosis not present

## 2021-07-19 DIAGNOSIS — J069 Acute upper respiratory infection, unspecified: Secondary | ICD-10-CM | POA: Diagnosis not present

## 2021-07-19 DIAGNOSIS — Z20822 Contact with and (suspected) exposure to covid-19: Secondary | ICD-10-CM | POA: Diagnosis not present

## 2021-09-13 DIAGNOSIS — E039 Hypothyroidism, unspecified: Secondary | ICD-10-CM | POA: Diagnosis not present

## 2021-09-13 DIAGNOSIS — E538 Deficiency of other specified B group vitamins: Secondary | ICD-10-CM | POA: Diagnosis not present

## 2021-09-13 DIAGNOSIS — E559 Vitamin D deficiency, unspecified: Secondary | ICD-10-CM | POA: Diagnosis not present

## 2021-09-13 DIAGNOSIS — R7303 Prediabetes: Secondary | ICD-10-CM | POA: Diagnosis not present

## 2021-09-13 DIAGNOSIS — N959 Unspecified menopausal and perimenopausal disorder: Secondary | ICD-10-CM | POA: Diagnosis not present

## 2021-09-14 DIAGNOSIS — Z01818 Encounter for other preprocedural examination: Secondary | ICD-10-CM | POA: Diagnosis not present

## 2021-09-24 NOTE — Progress Notes (Signed)
Sandra Stanton [address] [place] [address] Phone: [phone] Subjective:   Sandra Stanton, am serving as a scribe for Dr. Antoine Primas.  This visit occurred during the SARS-CoV-2 public health emergency.  Safety protocols were in place, including screening questions prior to the visit, additional usage of staff PPE, and extensive cleaning of exam room while observing appropriate contact time as indicated for disinfecting solutions.    I'm seeing this patient by the request  of:  Sandra Stanton, [place]  CC: Right elbow pain  [id number]  07/05/2021 Patient has made significant improvement at this time.  Would like patient to start increasing activity slowly.  Discussed icing regimen and home exercise, discussed which activities to do and which ones to avoid, increase activity slowly.  Patient will should hopefully do very well  Updated 09/27/2021 Sandra Stanton is a 61 y.o. female coming in with complaint of right elbow pain. Patient states that she has returned to her normal activities. Only has pain over lateral aspect when elbow is bent and she is sleeping. Pain over medial aspect of R elbow continues to not improve. Patient is fearful to pick things up due to pain in R elbow. 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 Smaller blades do not elicit pain.        Past Medical History:  Diagnosis Date   Anxiety    Depression    Hypothyroidism    PONV (postoperative nausea and vomiting)    Past Surgical History:  Procedure Laterality Date   ABDOMINAL HYSTERECTOMY     BREAST BIOPSY  2008   CESAREAN SECTION  [id number]   GALLBLADDER SURGERY  2007   TENNIS ELBOW RELEASE/NIRSCHEL PROCEDURE Right 08/17/2020   Procedure: RIGHT ELBOW LATERAL DEBRIDEMENT AND TENDON REPAIR;  Surgeon: Jones Broom, MD;  [facility];  Service: Orthopedics;  Laterality: Right;  REGIONAL   Social History   Socioeconomic History    Marital status: Married    Spouse name: Not on file   Number of children: Not on file   Years of education: Not on file   Highest education level: Not on file  Occupational History   Not on file  Tobacco Use   Smoking status: Former   Smokeless tobacco: Never  Substance and Sexual Activity   Alcohol use: Yes    Comment: 3x per week   Drug use: Never   Sexual activity: Not on file  Other Topics Concern   Not on file  Social History Narrative   Not on file   Social Determinants of Health   Financial Resource Strain: Not on file  Food Insecurity: Not on file  Transportation Needs: Not on file  Physical Activity: Not on file  Stress: Not on file  Social Connections: Not on file   Allergies  Allergen Reactions   Codeine Nausea And Vomiting   Morphine And Related Nausea And Vomiting   Penicillins Hives   No family history on file.  Current Outpatient Medications (Endocrine & Metabolic):    ARMOUR THYROID PO, Take by mouth.   predniSONE (DELTASONE) 20 MG tablet, Take 1 tablet (20 mg total) by mouth daily with breakfast.      Current Outpatient Medications (Other):    buPROPion (WELLBUTRIN) 100 MG tablet, Take 100 mg by mouth 2 (two) times daily.   calcium carbonate (OS-CAL - DOSED IN MG OF ELEMENTAL CALCIUM) 1250 (500 Ca) MG tablet, Take 1 tablet by mouth.  escitalopram (LEXAPRO) 10 MG tablet, Take 10 mg by mouth daily.   Omega-3 Fatty Acids (FISH OIL) 1000 MG CAPS, Take by mouth.   tiZANidine (ZANAFLEX) 4 MG tablet, Take 1 tablet (4 mg total) by mouth every 8 (eight) hours as needed for muscle spasms.   vitamin k 100 MCG tablet, Take 100 mcg by mouth daily.   Reviewed prior external information including notes and imaging from  primary care provider As well as notes that were available from care everywhere and other healthcare systems.  Past medical history, social, surgical and family history all reviewed in electronic medical record.  No pertanent information  unless stated regarding to the chief complaint.   Review of Systems:  No headache, visual changes, nausea, vomiting, diarrhea, constipation, dizziness, abdominal pain, skin rash, fevers, chills, night sweats, weight loss, swollen lymph nodes, body aches, joint swelling, chest pain, shortness of breath, mood changes. POSITIVE muscle aches  Objective  There were no vitals taken for this visit.   General: No apparent distress alert and oriented x3 mood and affect normal, dressed appropriately.  HEENT: Pupils equal, extraocular movements intact  Respiratory: Patient's speak in full sentences and does not appear short of breath  Cardiovascular: No lower extremity edema, non tender, no erythema  Gait normal with good balance and coordination.  MSK:  Non tender with full range of motion and good stability and symmetric strength and tone of shoulders, elbows, wrist, hip, knee and ankles bilaterally.     Impression and Recommendations:     The above documentation has been reviewed and is accurate and complete Sandra Stanton

## 2021-09-27 ENCOUNTER — Encounter: Payer: Self-pay | Admitting: Family Medicine

## 2021-09-27 ENCOUNTER — Ambulatory Visit (INDEPENDENT_AMBULATORY_CARE_PROVIDER_SITE_OTHER): Payer: 59 | Admitting: Family Medicine

## 2021-09-27 ENCOUNTER — Ambulatory Visit: Payer: Self-pay

## 2021-09-27 VITALS — BP 120/82 | HR 102 | Ht 66.0 in | Wt 151.0 lb

## 2021-09-27 DIAGNOSIS — M7701 Medial epicondylitis, right elbow: Secondary | ICD-10-CM | POA: Insufficient documentation

## 2021-09-27 DIAGNOSIS — M25521 Pain in right elbow: Secondary | ICD-10-CM | POA: Diagnosis not present

## 2021-09-27 NOTE — Patient Instructions (Signed)
Think we are on the verge of being pain free June start lifting weights Keep hands in peripheral vision See you again in 8 weeks

## 2021-09-27 NOTE — Assessment & Plan Note (Signed)
Seems to be muscular at this juncture.  Patient given injection today with patient not responding to anything else.  Concern for potential ulnar neuropathy as well but no significant radicular symptoms.  We discussed icing regimen and home exercises, which activities to do which ones to avoid, increase activity slowly.  Discussed icing regimen.  Follow-up with me again in 6 to 8 weeks.

## 2021-11-22 ENCOUNTER — Ambulatory Visit: Payer: 59 | Admitting: Family Medicine

## 2021-12-15 DIAGNOSIS — Z13220 Encounter for screening for lipoid disorders: Secondary | ICD-10-CM | POA: Diagnosis not present

## 2021-12-15 DIAGNOSIS — Z0000 Encounter for general adult medical examination without abnormal findings: Secondary | ICD-10-CM | POA: Diagnosis not present

## 2021-12-23 NOTE — Progress Notes (Deleted)
[name] [address] [place] [address] Phone: [phone] Subjective:    I'm seeing this patient by the request  of:  Wilfrid Lund, PA  CC:   [id number]  09/27/2021 Seems to be muscular at this juncture.  Patient given injection today with patient not responding to anything else.  Concern for potential ulnar neuropathy as well but no significant radicular symptoms.  We discussed icing regimen and home exercises, which activities to do which ones to avoid, increase activity slowly.  Discussed icing regimen.  Follow-up with me again in 6 to 8 weeks.  Updated 12/28/2021 Sandra Stanton is a 61 y.o. female coming in with complaint of elbow pain      Past Medical History:  Diagnosis Date   Anxiety    Depression    Hypothyroidism    PONV (postoperative nausea and vomiting)    Past Surgical History:  Procedure Laterality Date   ABDOMINAL HYSTERECTOMY     BREAST BIOPSY  2008   CESAREAN SECTION  [id number]   GALLBLADDER SURGERY  2007   TENNIS ELBOW RELEASE/NIRSCHEL PROCEDURE Right 08/17/2020   Procedure: RIGHT ELBOW LATERAL DEBRIDEMENT AND TENDON REPAIR;  Surgeon: Jones Broom, MD;  Location: [facility];  Service: Orthopedics;  Laterality: Right;  REGIONAL   Social History   Socioeconomic History   Marital status: Married    Spouse name: Not on file   Number of children: Not on file   Years of education: Not on file   Highest education level: Not on file  Occupational History   Not on file  Tobacco Use   Smoking status: Former   Smokeless tobacco: Never  Substance and Sexual Activity   Alcohol use: Yes    Comment: 3x per week   Drug use: Never   Sexual activity: Not on file  Other Topics Concern   Not on file  Social History Narrative   Not on file   Social Determinants of Health   Financial Resource Strain: Not on file  Food Insecurity: Not on file  Transportation Needs: Not on file  Physical  Activity: Not on file  Stress: Not on file  Social Connections: Not on file   Allergies  Allergen Reactions   Codeine Nausea And Vomiting   Morphine And Related Nausea And Vomiting   Penicillins Hives   No family history on file.  Current Outpatient Medications (Endocrine & Metabolic):    ARMOUR THYROID PO, Take by mouth.   predniSONE (DELTASONE) 20 MG tablet, Take 1 tablet (20 mg total) by mouth daily with breakfast.      Current Outpatient Medications (Other):    buPROPion (WELLBUTRIN) 100 MG tablet, Take 100 mg by mouth 2 (two) times daily.   calcium carbonate (OS-CAL - DOSED IN MG OF ELEMENTAL CALCIUM) 1250 (500 Ca) MG tablet, Take 1 tablet by mouth.   escitalopram (LEXAPRO) 10 MG tablet, Take 10 mg by mouth daily.   Omega-3 Fatty Acids (FISH OIL) 1000 MG CAPS, Take by mouth.   tiZANidine (ZANAFLEX) 4 MG tablet, Take 1 tablet (4 mg total) by mouth every 8 (eight) hours as needed for muscle spasms.   vitamin k 100 MCG tablet, Take 100 mcg by mouth daily.   Reviewed prior external information including notes and imaging from  primary care provider As well as notes that were available from care everywhere and other healthcare systems.  Past medical history, social, surgical and family history all reviewed in electronic medical  record.  No pertanent information unless stated regarding to the chief complaint.   Review of Systems:  No headache, visual changes, nausea, vomiting, diarrhea, constipation, dizziness, abdominal pain, skin rash, fevers, chills, night sweats, weight loss, swollen lymph nodes, body aches, joint swelling, chest pain, shortness of breath, mood changes. POSITIVE muscle aches  Objective  There were no vitals taken for this visit.   General: No apparent distress alert and oriented x3 mood and affect normal, dressed appropriately.  HEENT: Pupils equal, extraocular movements intact  Respiratory: Patient's speak in full sentences and does not appear short of  breath  Cardiovascular: No lower extremity edema, non tender, no erythema      Impression and Recommendations:

## 2021-12-28 ENCOUNTER — Ambulatory Visit: Payer: 59 | Admitting: Family Medicine

## 2022-01-25 DIAGNOSIS — R69 Illness, unspecified: Secondary | ICD-10-CM | POA: Diagnosis not present

## 2022-01-25 DIAGNOSIS — R41840 Attention and concentration deficit: Secondary | ICD-10-CM | POA: Diagnosis not present

## 2022-01-25 DIAGNOSIS — E039 Hypothyroidism, unspecified: Secondary | ICD-10-CM | POA: Diagnosis not present

## 2022-01-25 DIAGNOSIS — N951 Menopausal and female climacteric states: Secondary | ICD-10-CM | POA: Diagnosis not present

## 2022-02-14 DIAGNOSIS — R053 Chronic cough: Secondary | ICD-10-CM | POA: Diagnosis not present

## 2022-02-14 DIAGNOSIS — J40 Bronchitis, not specified as acute or chronic: Secondary | ICD-10-CM | POA: Diagnosis not present

## 2022-04-06 DIAGNOSIS — R41840 Attention and concentration deficit: Secondary | ICD-10-CM | POA: Diagnosis not present

## 2022-05-23 DIAGNOSIS — R42 Dizziness and giddiness: Secondary | ICD-10-CM | POA: Diagnosis not present

## 2022-05-23 DIAGNOSIS — R69 Illness, unspecified: Secondary | ICD-10-CM | POA: Diagnosis not present

## 2022-05-23 DIAGNOSIS — N959 Unspecified menopausal and perimenopausal disorder: Secondary | ICD-10-CM | POA: Diagnosis not present

## 2022-05-23 DIAGNOSIS — R5383 Other fatigue: Secondary | ICD-10-CM | POA: Diagnosis not present

## 2022-05-23 DIAGNOSIS — R140 Abdominal distension (gaseous): Secondary | ICD-10-CM | POA: Diagnosis not present

## 2022-05-23 DIAGNOSIS — E039 Hypothyroidism, unspecified: Secondary | ICD-10-CM | POA: Diagnosis not present

## 2022-05-23 DIAGNOSIS — R4189 Other symptoms and signs involving cognitive functions and awareness: Secondary | ICD-10-CM | POA: Diagnosis not present

## 2022-05-23 DIAGNOSIS — E559 Vitamin D deficiency, unspecified: Secondary | ICD-10-CM | POA: Diagnosis not present

## 2022-05-23 DIAGNOSIS — R7989 Other specified abnormal findings of blood chemistry: Secondary | ICD-10-CM | POA: Diagnosis not present

## 2022-05-23 DIAGNOSIS — Z1589 Genetic susceptibility to other disease: Secondary | ICD-10-CM | POA: Diagnosis not present

## 2022-05-23 DIAGNOSIS — G479 Sleep disorder, unspecified: Secondary | ICD-10-CM | POA: Diagnosis not present

## 2022-05-30 DIAGNOSIS — Z961 Presence of intraocular lens: Secondary | ICD-10-CM | POA: Diagnosis not present

## 2022-06-20 DIAGNOSIS — J019 Acute sinusitis, unspecified: Secondary | ICD-10-CM | POA: Diagnosis not present

## 2022-06-20 DIAGNOSIS — B9689 Other specified bacterial agents as the cause of diseases classified elsewhere: Secondary | ICD-10-CM | POA: Diagnosis not present

## 2022-06-28 DIAGNOSIS — F900 Attention-deficit hyperactivity disorder, predominantly inattentive type: Secondary | ICD-10-CM | POA: Diagnosis not present

## 2022-06-28 DIAGNOSIS — R69 Illness, unspecified: Secondary | ICD-10-CM | POA: Diagnosis not present

## 2022-07-14 DIAGNOSIS — F330 Major depressive disorder, recurrent, mild: Secondary | ICD-10-CM | POA: Diagnosis not present

## 2022-07-14 DIAGNOSIS — F900 Attention-deficit hyperactivity disorder, predominantly inattentive type: Secondary | ICD-10-CM | POA: Diagnosis not present

## 2022-07-26 DIAGNOSIS — B009 Herpesviral infection, unspecified: Secondary | ICD-10-CM | POA: Diagnosis not present

## 2022-07-26 DIAGNOSIS — E039 Hypothyroidism, unspecified: Secondary | ICD-10-CM | POA: Diagnosis not present

## 2022-07-26 DIAGNOSIS — N951 Menopausal and female climacteric states: Secondary | ICD-10-CM | POA: Diagnosis not present

## 2022-07-26 DIAGNOSIS — R5383 Other fatigue: Secondary | ICD-10-CM | POA: Diagnosis not present

## 2022-07-26 DIAGNOSIS — Z8639 Personal history of other endocrine, nutritional and metabolic disease: Secondary | ICD-10-CM | POA: Diagnosis not present

## 2022-07-26 DIAGNOSIS — R41840 Attention and concentration deficit: Secondary | ICD-10-CM | POA: Diagnosis not present

## 2022-07-26 DIAGNOSIS — F329 Major depressive disorder, single episode, unspecified: Secondary | ICD-10-CM | POA: Diagnosis not present

## 2022-08-02 DIAGNOSIS — F330 Major depressive disorder, recurrent, mild: Secondary | ICD-10-CM | POA: Diagnosis not present

## 2022-08-02 DIAGNOSIS — F900 Attention-deficit hyperactivity disorder, predominantly inattentive type: Secondary | ICD-10-CM | POA: Diagnosis not present

## 2022-09-07 DIAGNOSIS — R454 Irritability and anger: Secondary | ICD-10-CM | POA: Diagnosis not present

## 2022-09-07 DIAGNOSIS — B009 Herpesviral infection, unspecified: Secondary | ICD-10-CM | POA: Diagnosis not present

## 2022-09-07 DIAGNOSIS — F411 Generalized anxiety disorder: Secondary | ICD-10-CM | POA: Diagnosis not present

## 2022-09-07 DIAGNOSIS — M25522 Pain in left elbow: Secondary | ICD-10-CM | POA: Diagnosis not present

## 2022-09-07 DIAGNOSIS — R41840 Attention and concentration deficit: Secondary | ICD-10-CM | POA: Diagnosis not present

## 2022-09-07 DIAGNOSIS — F329 Major depressive disorder, single episode, unspecified: Secondary | ICD-10-CM | POA: Diagnosis not present

## 2022-09-29 DIAGNOSIS — F332 Major depressive disorder, recurrent severe without psychotic features: Secondary | ICD-10-CM | POA: Diagnosis not present

## 2022-09-29 DIAGNOSIS — F411 Generalized anxiety disorder: Secondary | ICD-10-CM | POA: Diagnosis not present

## 2022-09-29 DIAGNOSIS — F4312 Post-traumatic stress disorder, chronic: Secondary | ICD-10-CM | POA: Diagnosis not present

## 2022-10-20 DIAGNOSIS — F900 Attention-deficit hyperactivity disorder, predominantly inattentive type: Secondary | ICD-10-CM | POA: Diagnosis not present

## 2022-10-20 DIAGNOSIS — F330 Major depressive disorder, recurrent, mild: Secondary | ICD-10-CM | POA: Diagnosis not present

## 2022-10-31 DIAGNOSIS — N959 Unspecified menopausal and perimenopausal disorder: Secondary | ICD-10-CM | POA: Diagnosis not present

## 2022-10-31 DIAGNOSIS — R42 Dizziness and giddiness: Secondary | ICD-10-CM | POA: Diagnosis not present

## 2022-10-31 DIAGNOSIS — G479 Sleep disorder, unspecified: Secondary | ICD-10-CM | POA: Diagnosis not present

## 2022-10-31 DIAGNOSIS — E039 Hypothyroidism, unspecified: Secondary | ICD-10-CM | POA: Diagnosis not present

## 2022-10-31 DIAGNOSIS — R7989 Other specified abnormal findings of blood chemistry: Secondary | ICD-10-CM | POA: Diagnosis not present

## 2022-10-31 DIAGNOSIS — R5383 Other fatigue: Secondary | ICD-10-CM | POA: Diagnosis not present

## 2022-10-31 DIAGNOSIS — F988 Other specified behavioral and emotional disorders with onset usually occurring in childhood and adolescence: Secondary | ICD-10-CM | POA: Diagnosis not present

## 2022-10-31 DIAGNOSIS — R4189 Other symptoms and signs involving cognitive functions and awareness: Secondary | ICD-10-CM | POA: Diagnosis not present

## 2022-10-31 DIAGNOSIS — E559 Vitamin D deficiency, unspecified: Secondary | ICD-10-CM | POA: Diagnosis not present

## 2022-10-31 DIAGNOSIS — R140 Abdominal distension (gaseous): Secondary | ICD-10-CM | POA: Diagnosis not present

## 2022-11-29 DIAGNOSIS — L814 Other melanin hyperpigmentation: Secondary | ICD-10-CM | POA: Diagnosis not present

## 2022-11-29 DIAGNOSIS — D225 Melanocytic nevi of trunk: Secondary | ICD-10-CM | POA: Diagnosis not present

## 2022-11-29 DIAGNOSIS — D2261 Melanocytic nevi of right upper limb, including shoulder: Secondary | ICD-10-CM | POA: Diagnosis not present

## 2022-11-29 DIAGNOSIS — L821 Other seborrheic keratosis: Secondary | ICD-10-CM | POA: Diagnosis not present

## 2022-11-29 DIAGNOSIS — L570 Actinic keratosis: Secondary | ICD-10-CM | POA: Diagnosis not present

## 2022-11-29 DIAGNOSIS — D2272 Melanocytic nevi of left lower limb, including hip: Secondary | ICD-10-CM | POA: Diagnosis not present

## 2022-11-29 DIAGNOSIS — D2271 Melanocytic nevi of right lower limb, including hip: Secondary | ICD-10-CM | POA: Diagnosis not present

## 2022-11-29 DIAGNOSIS — Z85820 Personal history of malignant melanoma of skin: Secondary | ICD-10-CM | POA: Diagnosis not present

## 2022-11-29 DIAGNOSIS — D1801 Hemangioma of skin and subcutaneous tissue: Secondary | ICD-10-CM | POA: Diagnosis not present

## 2022-12-19 DIAGNOSIS — F900 Attention-deficit hyperactivity disorder, predominantly inattentive type: Secondary | ICD-10-CM | POA: Diagnosis not present

## 2022-12-19 DIAGNOSIS — F330 Major depressive disorder, recurrent, mild: Secondary | ICD-10-CM | POA: Diagnosis not present

## 2023-01-26 DIAGNOSIS — F900 Attention-deficit hyperactivity disorder, predominantly inattentive type: Secondary | ICD-10-CM | POA: Diagnosis not present

## 2023-01-26 DIAGNOSIS — F330 Major depressive disorder, recurrent, mild: Secondary | ICD-10-CM | POA: Diagnosis not present

## 2023-01-31 DIAGNOSIS — J0100 Acute maxillary sinusitis, unspecified: Secondary | ICD-10-CM | POA: Diagnosis not present

## 2023-02-13 DIAGNOSIS — F900 Attention-deficit hyperactivity disorder, predominantly inattentive type: Secondary | ICD-10-CM | POA: Diagnosis not present

## 2023-02-13 DIAGNOSIS — F330 Major depressive disorder, recurrent, mild: Secondary | ICD-10-CM | POA: Diagnosis not present

## 2023-03-02 DIAGNOSIS — H1131 Conjunctival hemorrhage, right eye: Secondary | ICD-10-CM | POA: Diagnosis not present

## 2023-03-06 DIAGNOSIS — F900 Attention-deficit hyperactivity disorder, predominantly inattentive type: Secondary | ICD-10-CM | POA: Diagnosis not present

## 2023-03-06 DIAGNOSIS — F330 Major depressive disorder, recurrent, mild: Secondary | ICD-10-CM | POA: Diagnosis not present

## 2023-03-14 DIAGNOSIS — H1131 Conjunctival hemorrhage, right eye: Secondary | ICD-10-CM | POA: Diagnosis not present

## 2023-03-30 DIAGNOSIS — F900 Attention-deficit hyperactivity disorder, predominantly inattentive type: Secondary | ICD-10-CM | POA: Diagnosis not present

## 2023-03-30 DIAGNOSIS — F331 Major depressive disorder, recurrent, moderate: Secondary | ICD-10-CM | POA: Diagnosis not present

## 2023-03-30 DIAGNOSIS — F411 Generalized anxiety disorder: Secondary | ICD-10-CM | POA: Diagnosis not present

## 2023-04-24 DIAGNOSIS — F330 Major depressive disorder, recurrent, mild: Secondary | ICD-10-CM | POA: Diagnosis not present

## 2023-04-24 DIAGNOSIS — F900 Attention-deficit hyperactivity disorder, predominantly inattentive type: Secondary | ICD-10-CM | POA: Diagnosis not present

## 2023-05-29 DIAGNOSIS — F330 Major depressive disorder, recurrent, mild: Secondary | ICD-10-CM | POA: Diagnosis not present

## 2023-05-29 DIAGNOSIS — F900 Attention-deficit hyperactivity disorder, predominantly inattentive type: Secondary | ICD-10-CM | POA: Diagnosis not present

## 2023-07-03 DIAGNOSIS — F330 Major depressive disorder, recurrent, mild: Secondary | ICD-10-CM | POA: Diagnosis not present

## 2023-07-03 DIAGNOSIS — F900 Attention-deficit hyperactivity disorder, predominantly inattentive type: Secondary | ICD-10-CM | POA: Diagnosis not present

## 2023-07-24 DIAGNOSIS — F900 Attention-deficit hyperactivity disorder, predominantly inattentive type: Secondary | ICD-10-CM | POA: Diagnosis not present

## 2023-07-24 DIAGNOSIS — F330 Major depressive disorder, recurrent, mild: Secondary | ICD-10-CM | POA: Diagnosis not present

## 2023-08-08 ENCOUNTER — Other Ambulatory Visit: Payer: Self-pay | Admitting: Family Medicine

## 2023-08-08 DIAGNOSIS — Z1231 Encounter for screening mammogram for malignant neoplasm of breast: Secondary | ICD-10-CM

## 2023-09-07 ENCOUNTER — Ambulatory Visit (INDEPENDENT_AMBULATORY_CARE_PROVIDER_SITE_OTHER)

## 2023-09-07 DIAGNOSIS — Z1231 Encounter for screening mammogram for malignant neoplasm of breast: Secondary | ICD-10-CM

## 2023-09-25 DIAGNOSIS — F332 Major depressive disorder, recurrent severe without psychotic features: Secondary | ICD-10-CM | POA: Diagnosis not present

## 2023-10-17 DIAGNOSIS — F332 Major depressive disorder, recurrent severe without psychotic features: Secondary | ICD-10-CM | POA: Diagnosis not present

## 2023-10-31 DIAGNOSIS — R5383 Other fatigue: Secondary | ICD-10-CM | POA: Diagnosis not present

## 2023-10-31 DIAGNOSIS — E559 Vitamin D deficiency, unspecified: Secondary | ICD-10-CM | POA: Diagnosis not present

## 2023-10-31 DIAGNOSIS — N959 Unspecified menopausal and perimenopausal disorder: Secondary | ICD-10-CM | POA: Diagnosis not present

## 2023-10-31 DIAGNOSIS — F988 Other specified behavioral and emotional disorders with onset usually occurring in childhood and adolescence: Secondary | ICD-10-CM | POA: Diagnosis not present

## 2023-10-31 DIAGNOSIS — R4189 Other symptoms and signs involving cognitive functions and awareness: Secondary | ICD-10-CM | POA: Diagnosis not present

## 2023-10-31 DIAGNOSIS — R7989 Other specified abnormal findings of blood chemistry: Secondary | ICD-10-CM | POA: Diagnosis not present

## 2023-10-31 DIAGNOSIS — E039 Hypothyroidism, unspecified: Secondary | ICD-10-CM | POA: Diagnosis not present

## 2023-10-31 DIAGNOSIS — R140 Abdominal distension (gaseous): Secondary | ICD-10-CM | POA: Diagnosis not present

## 2023-11-08 DIAGNOSIS — F331 Major depressive disorder, recurrent, moderate: Secondary | ICD-10-CM | POA: Diagnosis not present

## 2023-11-29 DIAGNOSIS — F331 Major depressive disorder, recurrent, moderate: Secondary | ICD-10-CM | POA: Diagnosis not present

## 2023-12-18 DIAGNOSIS — F332 Major depressive disorder, recurrent severe without psychotic features: Secondary | ICD-10-CM | POA: Diagnosis not present

## 2023-12-25 DIAGNOSIS — M25512 Pain in left shoulder: Secondary | ICD-10-CM | POA: Diagnosis not present

## 2023-12-25 DIAGNOSIS — B009 Herpesviral infection, unspecified: Secondary | ICD-10-CM | POA: Diagnosis not present

## 2023-12-25 DIAGNOSIS — N951 Menopausal and female climacteric states: Secondary | ICD-10-CM | POA: Diagnosis not present

## 2023-12-25 DIAGNOSIS — F329 Major depressive disorder, single episode, unspecified: Secondary | ICD-10-CM | POA: Diagnosis not present

## 2024-01-03 DIAGNOSIS — F331 Major depressive disorder, recurrent, moderate: Secondary | ICD-10-CM | POA: Diagnosis not present

## 2024-01-09 DIAGNOSIS — M25512 Pain in left shoulder: Secondary | ICD-10-CM | POA: Diagnosis not present

## 2024-01-15 DIAGNOSIS — F331 Major depressive disorder, recurrent, moderate: Secondary | ICD-10-CM | POA: Diagnosis not present

## 2024-01-17 DIAGNOSIS — F331 Major depressive disorder, recurrent, moderate: Secondary | ICD-10-CM | POA: Diagnosis not present

## 2024-01-31 DIAGNOSIS — F331 Major depressive disorder, recurrent, moderate: Secondary | ICD-10-CM | POA: Diagnosis not present

## 2024-03-20 DIAGNOSIS — L814 Other melanin hyperpigmentation: Secondary | ICD-10-CM | POA: Diagnosis not present

## 2024-03-20 DIAGNOSIS — Z85820 Personal history of malignant melanoma of skin: Secondary | ICD-10-CM | POA: Diagnosis not present

## 2024-03-20 DIAGNOSIS — D225 Melanocytic nevi of trunk: Secondary | ICD-10-CM | POA: Diagnosis not present

## 2024-03-20 DIAGNOSIS — D692 Other nonthrombocytopenic purpura: Secondary | ICD-10-CM | POA: Diagnosis not present

## 2024-03-20 DIAGNOSIS — L820 Inflamed seborrheic keratosis: Secondary | ICD-10-CM | POA: Diagnosis not present

## 2024-03-20 DIAGNOSIS — L821 Other seborrheic keratosis: Secondary | ICD-10-CM | POA: Diagnosis not present

## 2024-03-20 DIAGNOSIS — L918 Other hypertrophic disorders of the skin: Secondary | ICD-10-CM | POA: Diagnosis not present

## 2024-03-20 DIAGNOSIS — L570 Actinic keratosis: Secondary | ICD-10-CM | POA: Diagnosis not present

## 2024-03-20 DIAGNOSIS — D1801 Hemangioma of skin and subcutaneous tissue: Secondary | ICD-10-CM | POA: Diagnosis not present

## 2024-04-25 DIAGNOSIS — F331 Major depressive disorder, recurrent, moderate: Secondary | ICD-10-CM | POA: Diagnosis not present
